# Patient Record
Sex: Female | Born: 1950 | Race: White | Hispanic: No | Marital: Married | Smoking: Never smoker
Health system: Southern US, Community
[De-identification: ages and names within clinical notes are randomized; demographics above are authoritative.]

## PROBLEM LIST (undated history)

## (undated) DIAGNOSIS — M25862 Other specified joint disorders, left knee: Secondary | ICD-10-CM

## (undated) DIAGNOSIS — T7840XA Allergy, unspecified, initial encounter: Secondary | ICD-10-CM

## (undated) DIAGNOSIS — R197 Diarrhea, unspecified: Secondary | ICD-10-CM

## (undated) DIAGNOSIS — Z9889 Other specified postprocedural states: Secondary | ICD-10-CM

## (undated) DIAGNOSIS — R011 Cardiac murmur, unspecified: Secondary | ICD-10-CM

## (undated) DIAGNOSIS — C50919 Malignant neoplasm of unspecified site of unspecified female breast: Secondary | ICD-10-CM

## (undated) DIAGNOSIS — R112 Nausea with vomiting, unspecified: Secondary | ICD-10-CM

## (undated) HISTORY — DX: Malignant neoplasm of unspecified site of unspecified female breast: C50.919

## (undated) HISTORY — DX: Allergy, unspecified, initial encounter: T78.40XA

## (undated) HISTORY — PX: AUGMENTATION MAMMAPLASTY: SUR837

## (undated) HISTORY — DX: Diarrhea, unspecified: R19.7

## (undated) HISTORY — PX: BREAST RECONSTRUCTION WITH PLACEMENT OF TISSUE EXPANDER AND FLEX HD (ACELLULAR HYDRATED DERMIS): SHX6295

## (undated) HISTORY — DX: Other specified joint disorders, left knee: M25.862

## (undated) HISTORY — DX: Cardiac murmur, unspecified: R01.1

## (undated) HISTORY — PX: ABDOMINAL HYSTERECTOMY: SHX81

---

## 1999-02-19 ENCOUNTER — Other Ambulatory Visit: Admission: RE | Admit: 1999-02-19 | Discharge: 1999-02-19 | Payer: Self-pay | Admitting: Obstetrics & Gynecology

## 1999-08-04 ENCOUNTER — Inpatient Hospital Stay (HOSPITAL_COMMUNITY): Admission: EM | Admit: 1999-08-04 | Discharge: 1999-08-05 | Payer: Self-pay | Admitting: Emergency Medicine

## 1999-08-04 ENCOUNTER — Encounter: Payer: Self-pay | Admitting: Emergency Medicine

## 2000-02-24 ENCOUNTER — Other Ambulatory Visit: Admission: RE | Admit: 2000-02-24 | Discharge: 2000-02-24 | Payer: Self-pay | Admitting: Obstetrics & Gynecology

## 2001-05-16 ENCOUNTER — Other Ambulatory Visit: Admission: RE | Admit: 2001-05-16 | Discharge: 2001-05-16 | Payer: Self-pay | Admitting: Obstetrics & Gynecology

## 2002-08-16 ENCOUNTER — Other Ambulatory Visit: Admission: RE | Admit: 2002-08-16 | Discharge: 2002-08-16 | Payer: Self-pay | Admitting: Obstetrics & Gynecology

## 2003-10-22 ENCOUNTER — Other Ambulatory Visit: Admission: RE | Admit: 2003-10-22 | Discharge: 2003-10-22 | Payer: Self-pay | Admitting: Obstetrics & Gynecology

## 2004-09-26 HISTORY — PX: COLONOSCOPY: SHX174

## 2004-11-03 ENCOUNTER — Other Ambulatory Visit: Admission: RE | Admit: 2004-11-03 | Discharge: 2004-11-03 | Payer: Self-pay | Admitting: Obstetrics & Gynecology

## 2005-04-06 ENCOUNTER — Ambulatory Visit: Payer: Self-pay | Admitting: Family Medicine

## 2005-04-20 ENCOUNTER — Ambulatory Visit: Payer: Self-pay | Admitting: Internal Medicine

## 2005-05-04 ENCOUNTER — Ambulatory Visit: Payer: Self-pay | Admitting: Internal Medicine

## 2005-12-28 ENCOUNTER — Other Ambulatory Visit: Admission: RE | Admit: 2005-12-28 | Discharge: 2005-12-28 | Payer: Self-pay | Admitting: Obstetrics & Gynecology

## 2007-02-02 ENCOUNTER — Telehealth: Payer: Self-pay | Admitting: Family Medicine

## 2007-08-06 ENCOUNTER — Telehealth (INDEPENDENT_AMBULATORY_CARE_PROVIDER_SITE_OTHER): Payer: Self-pay | Admitting: Internal Medicine

## 2007-08-07 ENCOUNTER — Ambulatory Visit: Payer: Self-pay | Admitting: Family Medicine

## 2008-10-27 ENCOUNTER — Telehealth: Payer: Self-pay | Admitting: Family Medicine

## 2009-09-28 ENCOUNTER — Ambulatory Visit: Payer: Self-pay | Admitting: Family Medicine

## 2009-09-28 ENCOUNTER — Telehealth: Payer: Self-pay | Admitting: Family Medicine

## 2009-10-23 ENCOUNTER — Ambulatory Visit: Payer: Self-pay | Admitting: Family Medicine

## 2010-10-28 NOTE — Assessment & Plan Note (Signed)
Summary: drainage cough/mk   Vital Signs:  Patient profile:   60 year old female Height:      65 inches Weight:      136.50 pounds BMI:     22.80 Temp:     98 degrees F oral Pulse rate:   76 / minute Pulse rhythm:   regular BP sitting:   122 / 80  (left arm) Cuff size:   regular  Vitals Entered By: Delilah Shan CMA Duncan Dull) (October 23, 2009 11:04 AM) CC: Drainage, cough   History of Present Illness: 60 yo female who has had URI symptoms for 1 month.  Was seen here on 09/28/09, symptoms consistent with viral URI. Advised to take Tessalon and Mucinex.  Developped itchy rash so stopped taking them. Symptoms worsened so she tried plain guafeneisin and Tessalon and had rash again, unsure which medication is trigger. This week she feels worse, now has chills, productive cough,  and increased sinus pressure and she doesn't know what to  take. Afebrile here but feels like she is running a fever.  Taking Alleve and claritin with no relief of symptoms.  Current Medications (verified): 1)  Flonase 50 Mcg/act  Susp (Fluticasone Propionate) .... 2 Sprays Each Nostril Once Daily For Congestion 2)  Nasonex 50 Mcg/act Susp (Mometasone Furoate) .... Inhale 1 Spray in Each Nostril Once Daily 3)  Tessalon 200 Mg Caps (Benzonatate) .... One Tab By Mouth Two Times A Day As Needed For Cough. 4)  Azithromycin 250 Mg  Tabs (Azithromycin) .... 2 By  Mouth Today and Then 1 Daily For 4 Days  Allergies (verified): No Known Drug Allergies  Review of Systems      See HPI General:  Complains of chills and fever. ENT:  Complains of nasal congestion and sinus pressure; denies ear discharge, postnasal drainage, and sore throat. CV:  Denies chest pain or discomfort. Resp:  Complains of cough and sputum productive; denies shortness of breath and wheezing. GI:  Denies abdominal pain, diarrhea, nausea, and vomiting.  Physical Exam  General:  alert, well-developed, and well-nourished.   Ears:  TM retracted  bilaterally. Nose:  nasal dischargemucosal pallor.   sinuses +/- Mouth:  minimally injected without exudate, min gray thick PND. Lungs:  normal respiratory effort, no accessory muscle use, and normal breath sounds.   Heart:  Normal rate and regular rhythm. S1 and S2 normal without gallop, murmur, click, rub or other extra sounds. Extremities:  no edema Cervical Nodes:  No lymphadenopathy noted Psych:  Cognition and judgment appear intact. Alert and cooperative with normal attention span and concentration. No apparent delusions, illusions, hallucinations   Impression & Recommendations:  Problem # 1:  SINUSITIS, ACUTE (ICD-461.9) Assessment New Given duration and progression of symptoms, will treat with Abx.   IN terms of supportive care, would continue with NSAID and claritin since we are unsure which cough suppressants she can tolerate. Pt in agreement with plan. Her updated medication list for this problem includes:    Flonase 50 Mcg/act Susp (Fluticasone propionate) .Marland Kitchen... 2 sprays each nostril once daily for congestion    Nasonex 50 Mcg/act Susp (Mometasone furoate) ..... Inhale 1 spray in each nostril once daily    Tessalon 200 Mg Caps (Benzonatate) ..... One tab by mouth two times a day as needed for cough.    Azithromycin 250 Mg Tabs (Azithromycin) .Marland Kitchen... 2 by  mouth today and then 1 daily for 4 days  Complete Medication List: 1)  Flonase 50 Mcg/act Susp (Fluticasone propionate) .Marland KitchenMarland KitchenMarland Kitchen  2 sprays each nostril once daily for congestion 2)  Nasonex 50 Mcg/act Susp (Mometasone furoate) .... Inhale 1 spray in each nostril once daily 3)  Tessalon 200 Mg Caps (Benzonatate) .... One tab by mouth two times a day as needed for cough. 4)  Azithromycin 250 Mg Tabs (Azithromycin) .... 2 by  mouth today and then 1 daily for 4 days Prescriptions: AZITHROMYCIN 250 MG  TABS (AZITHROMYCIN) 2 by  mouth today and then 1 daily for 4 days  #6 x 0   Entered and Authorized by:   Ruthe Mannan MD   Signed by:    Ruthe Mannan MD on 10/23/2009   Method used:   [facility]        [facility] ([facility]       [address]       [place], [place]  [address]       Ph: [phone] or [phone]       Fax: [phone]   RxID:   [id number]   Current Allergies (reviewed today): No known allergies

## 2010-10-28 NOTE — Progress Notes (Signed)
Summary: ok to take flonase?  Phone Note Call from Patient Call back at Home Phone [phone]   Caller: Patient Summary of Call: Pt was seen earlier today and she is asking if ok to take her flonase after 24 hours.  If so, she will need some [facility].  Please advise. Initial call taken by: Lowella Petties CMA,  September 28, 2009 3:39 PM  Follow-up for Phone Call        Is ok. Script sent. Follow-up by: Shaune Leeks MD,  September 28, 2009 3:57 PM  Additional Follow-up for Phone Call Additional follow up Details #1::        Patient notified as instructed by telephone. Additional Follow-up by: Sydell Axon LPN,  September 28, 2009 5:04 PM    Prescriptions: FLONASE 50 MCG/ACT  SUSP (FLUTICASONE PROPIONATE) 2 sprays each nostril once daily for congestion  #1 x 0   Entered and Authorized by:   Shaune Leeks MD   Signed by:   Sydell Axon LPN on 57/84/6962   Method used:   [facility]        [facility] ([facility]ail[facility]       [address]       [place], [place]  [address]       Ph: [phone] or [phone]       Fax: [phone]   RxID:   [id number]

## 2010-10-28 NOTE — Assessment & Plan Note (Signed)
Summary: VIRUS/RASH/DLO   Vital Signs:  Patient profile:   60 year old female Weight:      136.25 pounds Temp:     98.1 degrees F oral Pulse rate:   72 / minute Pulse rhythm:   regular BP sitting:   128 / 82  (left arm) Cuff size:   regular  Vitals Entered By: Linde Gillis CMA [facility] Dull) (September 28, 2009 12:18 PM) CC: ? virus, rash   History of Present Illness: Pt here for "bad cold"...has taken Mucinex DM  and then took Nyquil for a few nights. She has been "Miserable" over the weekend with cough and clogged in the head. She has also used some Afrin, pouring it in her nose to open her up. She used the [employer] this AM. She has felt she had fever and has had chills. New Years Eve has been her worst day. She has not had headache. She has had minimal color changes and has minimal sinus pressure ini  the right max this AM, no bad ST. Rash bothered her, shower felt like pins.   Problems Prior to Update: 1)  Sinusitis, Acute  (ICD-461.9)  Medications Prior to Update: 1)  Flonase 50 Mcg/act  Susp (Fluticasone Propionate) .... 2 Sprays Each Nostril Once Daily For Congestion 2)  Augmentin 875-125 Mg  Tabs (Amoxicillin-Pot Clavulanate) .Marland Kitchen.. 1 Bid 3)  Nasonex 50 Mcg/act Susp (Mometasone Furoate) .... Inhale 1 Spray in Each Nostril Once Daily  Allergies (verified): No Known Drug Allergies  Physical Exam  General:  alert, well-developed, and well-nourished.   Head:  normocephalic, atraumatic, and no abnormalities observed.  Sinuses NT. Eyes:  Conjunctiva clear bilaterally.  Ears:  TMs nml with good LR. Nose:  Reasonably clear with septal deviation to the left. Mouth:  minimally injected without exudate, min gray thick PND. Neck:  No deformities, masses, or tenderness noted. Lungs:  normal respiratory effort, no accessory muscle use, and normal breath sounds.   Heart:  Normal rate and regular rhythm. S1 and S2 normal without gallop, murmur, click, rub or other extra sounds.   Impression  & Recommendations:  Problem # 1:  URI (ICD-465.9) Assessment New  See instructions.  Instructed on symptomatic treatment. Call if symptoms persist or worsen.   Her updated medication list for this problem includes:    Tessalon 200 Mg Caps (Benzonatate) ..... One tab by mouth two times a day as needed for cough.  Problem # 2:  DERMATITIS (ICD-692.9) Assessment: New  Med induced with most intense outbreak on the chest but involving the trunk vs viral axanthem vs dissem strep/staph disease. Favor viral exanthem , but stop all meds for 24 hrs and asess. See instructions.  Discussed avoidance of triggers and symptomatic treatment.   Complete Medication List: 1)  Flonase 50 Mcg/act Susp (Fluticasone propionate) .... 2 sprays each nostril once daily for congestion 2)  Nasonex 50 Mcg/act Susp (Mometasone furoate) .... Inhale 1 spray in each nostril once daily 3)  Tessalon 200 Mg Caps (Benzonatate) .... One tab by mouth two times a day as needed for cough.  Patient Instructions: 1)  Take nothing for congestion/cold/cough for 24 hrs. 2)  Take Guaifenesin by [facility] and getting MUCOUS RELIEF EXPECTORANT (400mg ), take 11/2 tabs by mouth AM and NOON. 3)  Drink lots of fluids anytime taking Guaifenesin.  4)  Take Aleve 2 tabs after brfst and supper.  5)  Tessalon 200mg  three times a day  Prescriptions: TESSALON  200 MG CAPS (BENZONATATE) one tab by mouth two times a day as needed for cough.  #30 x 0   Entered and Authorized by:   Shaune Leeks MD   Signed by:   Shaune Leeks MD on 09/28/2009   Method used:   Print then Give to Patient   RxID:   [id number]   Current Allergies (reviewed today): No known allergies

## 2011-02-11 NOTE — H&P (Signed)
[place]  Patient:    Shari Gonzalez                    MRN: [id number] Adm. Date:  [id number] Attending:  Hilario Quarry Dictator:   Cornell Barman, P.A. CC:         Freddy Finner, M.D.             Titus Dubin. Alwyn Ren, M.D. [facility]                         History and Physical  ADMITTING DIAGNOSES: 1. Hypoglycemia. 2. Syncope.  HISTORY OF PRESENT ILLNESS:  Shari Gonzalez is a 60 year old white female in her  usual state of health until around 10 a.m.  The patient vaguely recalls events nd relies history obtained from co-workers.  She was told that she hit her elbow, walked several steps and fell forward striking her head.  She is not clear if she lost consciousness.  She remembers co-workers talking to her and asking her if he was okay.  She states that she felt like she was in a dream.  She states that even later after arriving in the emergency room she was confused about todays events. EMS was called to the seen and at that time her Accu-Chek was 31.  They attempted to give her Insta-Glucose but were not able to.  They did manage to give her one amp of D50 IV.  On arrival at [facility] her CBG was 68.  Later when the R.N. checked her CBG at [facility], it was 51.  She was then given one amp of D50 and coke.  Later CBG was 252.  At that point, Dr. Rosalia Hammers was preparing to send the patient home when the patient became "shaky."  They checked a CBG again and it was low at 58.  On my examination, the patient denies feeling shaky.  She describes one remote syncopal episode that occurred while waiting in a cafeteria line.  She does note that she often feels shaky and tremulous around 10 a.m. in the morning.  She states that she feels better after eating protein, i.e. peanut butter, and having a soft drink.  The patient states that she does not eat breakfast. She states that in the past she has tried to drink juice in the mornings but states that the  shaky feeling is worse when she has juice in the morning.  She states it is better when she has nothing at all.  The patient states that she has found that if she increases her proteins nd limits the amount of carbohydrates that she takes that she has less periods of feeling shaky.  The patient denies any polyuria, polydipsia or change in vision. She denies any weight loss or weight gain.  MEDICATIONS: 1. Premarin 1.25 mg q.d. 2. Estratest.  PAST MEDICAL HISTORY: 1. Status post hysterectomy with bilateral oophorectomy secondary to ovarian cysts    and endometriosis in 1997. 2. History of nasal polyps, status post resection in 1994 and 1995.  The patient denies diabetes mellitus, thyroid disorder, pancreatic disease, hypertension, cancer, arrhythmias, coronary artery disease, liver disease, gestational diabetes.  SOCIAL HISTORY:  The patient is married and has a 37 year old son.  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  She has no history of tobacco nd occasional alcohol use.  FAMILY HISTORY:  Father living age 21, history of lung cancer and  history of kidney cancer, status post nephrectomy.   Mother deceased at 22 with Alzheimers disease. The patient does report that her grandmother did have adult-onset diabetes mellitus.  REVIEW OF SYSTEMS:  The patient gets regular mammograms and Pap smears and is up to date.  She denies nausea, vomiting, fever, or chills, although one week ago she had an episode of nausea associated with upper respiratory symptoms including headache and sinus drainage.  This was relieved with Tylenol Sinus and zinc lozengers. he denies dysuria, weight changes, or change in appetite.  She has greater than 15 years of history of "shakes."  This is better with protein.  Patient weight consistently ranges between 118 and 121.  PHYSICAL EXAMINATION:  VITAL SIGNS:  The patient is afebrile.  Blood pressure is 128/76.  GENERAL:   This is a well-developed, well-nourished thin white female in no acute distress.  HEENT:  Atraumatic, normocephalic.  Pupils are equal, round and reactive to light and accommodation.  Oropharynx was clear.  NECK:  Without thyromegaly.  LUNGS:  Clear without wheezes, rales or rhonchi.  CARDIOVASCULAR:  Regular rate and rhythm without murmurs, rubs or gallops.  ABDOMEN:  Bowel sounds are present.  Soft, nontender with hepatosplenomegaly.  EXTREMITIES: Without edema.  LABORATORY DATA:  CMET was normal, although her total bilirubin was elevated at  1.1.  SGOT was minimally elevated at 39.  Alcohol was less than 10.  CBC with diff was normal.  Urine drug screen was negative.  CT of the head of the negative.  Chest x-ray was negative.  IMPRESSION: 1. Hypoglycemia, etiology not clear. 2. Syncope secondary to #1.  PLAN: 1. Check CBGs. 2. Check thyroid profile. DD:  08/04/99 TD:  08/04/99 Job: [id number]

## 2011-02-24 ENCOUNTER — Other Ambulatory Visit: Payer: Self-pay | Admitting: Obstetrics & Gynecology

## 2011-02-24 DIAGNOSIS — R928 Other abnormal and inconclusive findings on diagnostic imaging of breast: Secondary | ICD-10-CM

## 2011-03-03 ENCOUNTER — Other Ambulatory Visit: Payer: Self-pay | Admitting: Obstetrics & Gynecology

## 2011-03-03 ENCOUNTER — Ambulatory Visit
Admission: RE | Admit: 2011-03-03 | Discharge: 2011-03-03 | Disposition: A | Discharge status: Home / Self Care | Payer: 59 | Source: Ambulatory Visit | Attending: Obstetrics & Gynecology | Admitting: Obstetrics & Gynecology

## 2011-03-03 DIAGNOSIS — R928 Other abnormal and inconclusive findings on diagnostic imaging of breast: Secondary | ICD-10-CM

## 2011-03-03 DIAGNOSIS — R921 Mammographic calcification found on diagnostic imaging of breast: Secondary | ICD-10-CM

## 2011-03-07 ENCOUNTER — Other Ambulatory Visit: Payer: Self-pay | Admitting: Obstetrics & Gynecology

## 2011-03-07 ENCOUNTER — Ambulatory Visit
Admission: RE | Admit: 2011-03-07 | Discharge: 2011-03-07 | Disposition: A | Discharge status: Home / Self Care | Payer: 59 | Source: Ambulatory Visit | Attending: Obstetrics & Gynecology | Admitting: Obstetrics & Gynecology

## 2011-03-07 ENCOUNTER — Other Ambulatory Visit: Payer: Self-pay | Admitting: Diagnostic Radiology

## 2011-03-07 DIAGNOSIS — Z9889 Other specified postprocedural states: Secondary | ICD-10-CM

## 2011-03-07 DIAGNOSIS — R921 Mammographic calcification found on diagnostic imaging of breast: Secondary | ICD-10-CM

## 2011-03-08 ENCOUNTER — Other Ambulatory Visit: Payer: Self-pay | Admitting: Obstetrics & Gynecology

## 2011-03-08 ENCOUNTER — Ambulatory Visit
Admission: RE | Admit: 2011-03-08 | Discharge: 2011-03-08 | Disposition: A | Discharge status: Home / Self Care | Payer: 59 | Source: Ambulatory Visit | Attending: Obstetrics & Gynecology | Admitting: Obstetrics & Gynecology

## 2011-03-08 DIAGNOSIS — C50912 Malignant neoplasm of unspecified site of left female breast: Secondary | ICD-10-CM

## 2011-03-08 DIAGNOSIS — Z9889 Other specified postprocedural states: Secondary | ICD-10-CM

## 2011-03-10 ENCOUNTER — Encounter (INDEPENDENT_AMBULATORY_CARE_PROVIDER_SITE_OTHER): Payer: Self-pay | Admitting: General Surgery

## 2011-03-10 ENCOUNTER — Ambulatory Visit (HOSPITAL_COMMUNITY)
Admission: RE | Admit: 2011-03-10 | Discharge: 2011-03-10 | Disposition: A | Discharge status: Home / Self Care | Payer: 59 | Source: Ambulatory Visit | Attending: Obstetrics & Gynecology | Admitting: Obstetrics & Gynecology

## 2011-03-10 ENCOUNTER — Other Ambulatory Visit (INDEPENDENT_AMBULATORY_CARE_PROVIDER_SITE_OTHER): Payer: Self-pay | Admitting: General Surgery

## 2011-03-10 DIAGNOSIS — C50912 Malignant neoplasm of unspecified site of left female breast: Secondary | ICD-10-CM

## 2011-03-10 DIAGNOSIS — D0590 Unspecified type of carcinoma in situ of unspecified breast: Secondary | ICD-10-CM | POA: Insufficient documentation

## 2011-03-10 DIAGNOSIS — C50919 Malignant neoplasm of unspecified site of unspecified female breast: Secondary | ICD-10-CM

## 2011-03-10 MED ORDER — GADOBENATE DIMEGLUMINE 529 MG/ML IV SOLN
15.0000 mL | Freq: Once | INTRAVENOUS | Status: AC | PRN
  Administered 2011-03-10: 15 mL via INTRAVENOUS

## 2011-03-11 ENCOUNTER — Encounter (HOSPITAL_BASED_OUTPATIENT_CLINIC_OR_DEPARTMENT_OTHER)
Admission: RE | Admit: 2011-03-11 | Discharge: 2011-03-11 | Disposition: A | Discharge status: Home / Self Care | Payer: 59 | Source: Ambulatory Visit | Attending: General Surgery | Admitting: General Surgery

## 2011-03-11 LAB — COMPREHENSIVE METABOLIC PANEL
ALT: 19 U/L (ref 0–35)
AST: 22 U/L (ref 0–37)
Alkaline Phosphatase: 52 U/L (ref 39–117)
CO2: 30 mEq/L (ref 19–32)
Calcium: 8.9 mg/dL (ref 8.4–10.5)
GFR calc Af Amer: >60 mL/min (ref >60)
GFR calc non Af Amer: >60 mL/min (ref >60)
Glucose, Bld: 96 mg/dL (ref 70–99)
Potassium: 3.8 mEq/L (ref 3.5–5.1)
Sodium: 138 mEq/L (ref 135–145)
Total Protein: 6.5 g/dL (ref 6.0–8.3)

## 2011-03-11 LAB — URINALYSIS, ROUTINE W REFLEX MICROSCOPIC
Bilirubin Urine: NEGATIVE
Glucose, UA: NEGATIVE mg/dL
Hgb urine dipstick: NEGATIVE
Ketones, ur: NEGATIVE mg/dL
Protein, ur: NEGATIVE mg/dL
Urobilinogen, UA: 0.2 mg/dL (ref 0.0–1.0)

## 2011-03-11 LAB — URINE MICROSCOPIC-ADD ON

## 2011-03-12 ENCOUNTER — Ambulatory Visit (HOSPITAL_COMMUNITY): Admission: RE | Admit: 2011-03-12 | Payer: 59 | Source: Ambulatory Visit

## 2011-03-13 ENCOUNTER — Other Ambulatory Visit: Payer: 59

## 2011-03-14 ENCOUNTER — Other Ambulatory Visit (INDEPENDENT_AMBULATORY_CARE_PROVIDER_SITE_OTHER): Payer: Self-pay | Admitting: General Surgery

## 2011-03-14 ENCOUNTER — Ambulatory Visit
Admission: RE | Admit: 2011-03-14 | Discharge: 2011-03-14 | Disposition: A | Discharge status: Home / Self Care | Payer: 59 | Source: Ambulatory Visit | Attending: General Surgery | Admitting: General Surgery

## 2011-03-14 ENCOUNTER — Ambulatory Visit (HOSPITAL_BASED_OUTPATIENT_CLINIC_OR_DEPARTMENT_OTHER)
Admission: RE | Admit: 2011-03-14 | Discharge: 2011-03-14 | Disposition: A | Discharge status: Home / Self Care | Payer: 59 | Source: Ambulatory Visit | Attending: General Surgery | Admitting: General Surgery

## 2011-03-14 DIAGNOSIS — Z01810 Encounter for preprocedural cardiovascular examination: Secondary | ICD-10-CM | POA: Insufficient documentation

## 2011-03-14 DIAGNOSIS — D0590 Unspecified type of carcinoma in situ of unspecified breast: Secondary | ICD-10-CM | POA: Insufficient documentation

## 2011-03-14 DIAGNOSIS — C50919 Malignant neoplasm of unspecified site of unspecified female breast: Secondary | ICD-10-CM

## 2011-03-14 DIAGNOSIS — C4492 Squamous cell carcinoma of skin, unspecified: Secondary | ICD-10-CM | POA: Insufficient documentation

## 2011-03-15 ENCOUNTER — Other Ambulatory Visit: Payer: 59

## 2011-03-15 ENCOUNTER — Encounter: Payer: Self-pay | Admitting: Family Medicine

## 2011-03-16 ENCOUNTER — Ambulatory Visit
Admission: RE | Admit: 2011-03-16 | Discharge: 2011-03-16 | Disposition: A | Discharge status: Home / Self Care | Payer: 59 | Source: Ambulatory Visit | Attending: Radiation Oncology | Admitting: Radiation Oncology

## 2011-03-16 DIAGNOSIS — Z90710 Acquired absence of both cervix and uterus: Secondary | ICD-10-CM | POA: Insufficient documentation

## 2011-03-16 DIAGNOSIS — C50419 Malignant neoplasm of upper-outer quadrant of unspecified female breast: Secondary | ICD-10-CM | POA: Insufficient documentation

## 2011-03-16 DIAGNOSIS — Z79899 Other long term (current) drug therapy: Secondary | ICD-10-CM | POA: Insufficient documentation

## 2011-03-16 DIAGNOSIS — Z85828 Personal history of other malignant neoplasm of skin: Secondary | ICD-10-CM | POA: Insufficient documentation

## 2011-03-16 NOTE — Op Note (Signed)
NAME:  Shari Gonzalez, Shari Gonzalez NO.:  [id number]  MEDICAL RECORD NO.:  [id number]  LOCATION:                                 FACILITY:  PHYSICIAN:  Angelia Mould. 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  OPERATIONS PERFORMED: 1. Left partial mastectomy with needle localization. 2. Re-excision of inferior margin, left breast lumpectomy site. 3. Excision 8 x 12 mm pigmented nevus, left breast skin at 5:30     position.  SURGEON:  Angelia Mould. Derrell Lolling, MD  OPERATIVE INDICATIONS:  This is a 60 year old Caucasian female in excellent health.  She gets mammograms annually.  Recent mammogram showed an approximately 1.5 cm area of microcalcifications in the upper outer quadrant of the left breast at about the 2:30 position.  Further imaging studies and image-guided biopsy were performed and pathology report showed ductal carcinoma in situ.  The estrogen receptor is pending.  MRI shows this to be a solitary finding, no greater than 2 cm in size.  She was evaluated as an outpatient and counseled regarding left partial mastectomy versus mastectomy.  She favored breast conservation.  She also had a red scaly nevus of the left breast skin at the 5:30 position which has been present for several months and she asked that be excised as well.  She is brought to operating room electively.  OPERATIVE TECHNIQUE:  Following the induction of general endotracheal anesthesia, the patient's left breast was prepped and draped in a sterile fashion.  Intravenous antibiotics were given.  Surgical time-out was held identifying correct patient and correct procedure and correct site.  Marcaine  0.5% with epinephrine was used as local infiltration anesthetic.  I reviewed the wire localization films and observed the wire entering from the very lateral part of the breast.  I chose to use a transverse radially oriented incision that went right through the wire insertion site.  Dissection was carried down into the breast tissue widely around the wire all the way down to the pectoralis fascia.  The specimen was removed and marked with a six color margin marker kit.  I felt that I was possibly close on the inferior margin, so I we excised to the inferior margin and also marked it with the ink on the new inferior margin and labeled it as such.  The specimen mammogram showed that the wire and the calcifications in the marker clip were within the specimen. The breast specimen and the reexcised inferior margin were sent to pathology.  The wound was irrigated with saline.  Hemostasis was excellent.  The breast tissues were closed in layers with interrupted sutures of 3-0 Vicryl and the skin closed with a running subcuticular suture of 4-0 Monocryl and Dermabond.  I then excised the nevus in the inferior breast at the 5:30 position. This was measured as 8 mL transversely x 12 mm longitudinally.  I made an elliptical incision around this with  about a 1-2 mm margin.  Using a sharp knife, I excised this all the way down into the subcutaneous tissue and sent it for routine histology.  Hemostasis was excellent, we then achieved with electrocautery.  The skin was closed with a running subcuticular suture of 4-0 Monocryl and Dermabond.  Ice pack was placed.  The patient tolerated the procedure well and was taken to the recovery room in stable condition.  Estimated blood loss was about 10-15 mL.  Complications none.  Sponge, needle, and instrument counts were correct.     Angelia Mould. Derrell Lolling, M.D.     HMI/MEDQ  D:  03/14/2011  T:  03/15/2011  Job:  [id number]  cc:   Daryl Eastern,  M.D. Marne A. Milinda Antis, MD  Electronically Signed by Claud Kelp M.D. on 03/16/2011 05:50:29 AM

## 2011-03-21 ENCOUNTER — Other Ambulatory Visit: Payer: Self-pay | Admitting: Oncology

## 2011-03-21 ENCOUNTER — Encounter (INDEPENDENT_AMBULATORY_CARE_PROVIDER_SITE_OTHER): Payer: Self-pay | Admitting: General Surgery

## 2011-03-21 ENCOUNTER — Ambulatory Visit (INDEPENDENT_AMBULATORY_CARE_PROVIDER_SITE_OTHER): Payer: 59 | Admitting: General Surgery

## 2011-03-21 ENCOUNTER — Encounter (HOSPITAL_BASED_OUTPATIENT_CLINIC_OR_DEPARTMENT_OTHER): Payer: 59 | Admitting: Oncology

## 2011-03-21 VITALS — BP 120/70 | HR 64 | Temp 98.4°F | Resp 16 | Ht 65.0 in | Wt 133.0 lb

## 2011-03-21 DIAGNOSIS — C50419 Malignant neoplasm of upper-outer quadrant of unspecified female breast: Secondary | ICD-10-CM

## 2011-03-21 DIAGNOSIS — C50919 Malignant neoplasm of unspecified site of unspecified female breast: Secondary | ICD-10-CM

## 2011-03-21 DIAGNOSIS — C50912 Malignant neoplasm of unspecified site of left female breast: Secondary | ICD-10-CM

## 2011-03-21 LAB — CBC WITH DIFFERENTIAL/PLATELET
BASO%: 0.4 % (ref 0.0–2.0)
LYMPH%: 22.8 % (ref 14.0–49.7)
MCHC: 34.4 g/dL (ref 31.5–36.0)
MCV: 92.1 fL (ref 79.5–101.0)
MONO%: 3.8 % (ref 0.0–14.0)
Platelets: 177 10*3/uL (ref 145–400)
RBC: 4.53 10*6/uL (ref 3.70–5.45)
WBC: 5.5 10*3/uL (ref 3.9–10.3)

## 2011-03-21 LAB — COMPREHENSIVE METABOLIC PANEL
ALT: 20 U/L (ref 0–35)
AST: 19 U/L (ref 0–37)
Albumin: 4.2 g/dL (ref 3.5–5.2)
Alkaline Phosphatase: 64 U/L (ref 39–117)
Potassium: 3.5 mEq/L (ref 3.5–5.3)
Sodium: 140 mEq/L (ref 135–145)
Total Bilirubin: 0.5 mg/dL (ref 0.3–1.2)
Total Protein: 7.4 g/dL (ref 6.0–8.3)

## 2011-03-21 NOTE — Patient Instructions (Signed)
You will see Dr. Odis Luster on March 23, 2011.   I will see you back in the office in 3 weeks for further discussion.  If you decide to proceed with surgery, we will schedule that in the interim.

## 2011-03-21 NOTE — Progress Notes (Signed)
Subjective:     Patient ID: Shari Gonzalez, female   DOB: [birth year], 60 y.o.   MRN: [id number]    BP 120/70  Pulse 64  Temp(Src) 98.4 F (36.9 C) (Temporal)  Resp 16  Ht 5\' 5"  (1.651 m)  Wt 133 lb (60.328 kg)  BMI 22.13 kg/m2    HPI Mrs. Shari Gonzalez underwent left partial mastectomy with needle localization, reexcision of inferior margin, and excision of a nevus of the left breast on March 14, 2011. The breast specimen shows multifocal high-grade ductal carcinoma in situ. The tumor was present at the superior margin in multiple areas. It was also present at the anterior margin. The tumor was very close to the inferior margin. On the reexcision of the inferior margin  It was close once again showing high-grade ductal carcinoma in situ with the tumor being 3 mm from the nearest resection margin. I have discussed the pathology report with the patient by phone, and with the patient and her husband in the office today. I have also reviewed his with Dr. Frederica Kuster in pathology. She is doing reasonably well with healing. I've explained her pathology report to her. 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 She already has scheduled an appointment at [facility] this Wednesday, March 23, 2011 to discuss reconstructive options if she chooses mastectomy. We spent a long time talking about the pathology report. We talked about options for intervention. She is aware that reexcision might result in negative margins with an acceptable cosmetic defect, might result in negative margins with an unacceptable cosmetic defect, or might result of positive margins and necessitate a ultimate mastectomy. She knows that proceeding to mastectomy is also acceptable option in this situation with high-grade multifocal ductal carcinoma in situ, and she is strongly considering that.  Review of Systems    Constitutional: Negative.   HENT: Negative.   Cardiovascular: Negative.   Genitourinary: Negative.   Psychiatric/Behavioral: Negative.        Objective:   Physical Exam  Neck: Normal range of motion. Neck supple. No tracheal deviation present. No thyromegaly present.  Pulmonary/Chest: Left breast exhibits skin change and tenderness. Left breast exhibits no inverted nipple and no mass. Breasts are symmetrical.    Lymphadenopathy:    She has no cervical adenopathy.       Assessment:     High-grade ductal carcinoma in situ, left breast, lateral quadrant. This is a multifocal ductal carcinoma in situ, and there are multiple areas of positive margins. Reexcision will be necessary to control her disease locally.    Plan:     I spent a long time talking about the extent of her disease. She is aware of her options for surgical intervention. She will see Dr. [facility] S[facility] this coming Wednesday to discuss options for reconstruction, should she use should she choose mastectomy. She is aware that reconstruction can be considered in the setting of a mastectomy, and can also be considered in the setting of a lumpectomy should cosmetic defect be unacceptable. At this time, and she will see Dr. Odis Luster, and will also make up with me in 2-3 weeks. Should she decide to proceed with scheduling of her surgery we will do that in the interim.

## 2011-03-24 ENCOUNTER — Other Ambulatory Visit (INDEPENDENT_AMBULATORY_CARE_PROVIDER_SITE_OTHER): Payer: Self-pay | Admitting: General Surgery

## 2011-03-24 ENCOUNTER — Encounter (INDEPENDENT_AMBULATORY_CARE_PROVIDER_SITE_OTHER): Payer: Self-pay | Admitting: General Surgery

## 2011-03-24 DIAGNOSIS — C50912 Malignant neoplasm of unspecified site of left female breast: Secondary | ICD-10-CM

## 2011-03-24 NOTE — Progress Notes (Signed)
Subjective:     Patient ID: Shari Gonzalez, female   DOB: [birth year], 60 y.o.   MRN: [id number]    There were no vitals taken for this visit.    HPI Shari Gonzalez has seen Dr. [facility] Sj[facility]. She has discussed reconstructive options and she has decided she does want to go ahead and proceed with left total mastectomy because of her multifocal DCIS. We are scheduling her for left total mastectomy and sentinel node biopsy, to be followed by reconstruction with Dr. [facility]. I had completed her placed a sheet and sent this to scheduling today.  Review of Systems     Objective:   Physical Exam     Assessment:        Plan:    schedule mastectomy and SLN.

## 2011-03-27 HISTORY — PX: MASTECTOMY: SHX3

## 2011-04-05 ENCOUNTER — Other Ambulatory Visit (HOSPITAL_COMMUNITY): Payer: 59

## 2011-04-06 ENCOUNTER — Encounter (HOSPITAL_COMMUNITY)
Admission: RE | Admit: 2011-04-06 | Discharge: 2011-04-06 | Disposition: A | Discharge status: Home / Self Care | Payer: 59 | Source: Ambulatory Visit | Attending: General Surgery | Admitting: General Surgery

## 2011-04-06 LAB — DIFFERENTIAL
Basophils Relative: 1 % (ref 0–1)
Eosinophils Absolute: 0.1 10*3/uL (ref 0.0–0.7)
Lymphs Abs: 1.4 10*3/uL (ref 0.7–4.0)
Monocytes Absolute: 0.4 10*3/uL (ref 0.1–1.0)
Monocytes Relative: 7 % (ref 3–12)

## 2011-04-06 LAB — COMPREHENSIVE METABOLIC PANEL
AST: 41 U/L — ABNORMAL HIGH (ref 0–37)
Albumin: 4.5 g/dL (ref 3.5–5.2)
Chloride: 101 mEq/L (ref 96–112)
Creatinine, Ser: 0.69 mg/dL (ref 0.50–1.10)
Potassium: 3.8 mEq/L (ref 3.5–5.1)
Total Bilirubin: 0.5 mg/dL (ref 0.3–1.2)
Total Protein: 7.3 g/dL (ref 6.0–8.3)

## 2011-04-06 LAB — URINALYSIS, ROUTINE W REFLEX MICROSCOPIC
Ketones, ur: NEGATIVE mg/dL
Leukocytes, UA: NEGATIVE
Nitrite: NEGATIVE
Specific Gravity, Urine: 1.009 (ref 1.005–1.030)
pH: 6.5 (ref 5.0–8.0)

## 2011-04-06 LAB — CBC
Platelets: 176 10*3/uL (ref 150–400)
RBC: 4.7 MIL/uL (ref 3.87–5.11)
WBC: 5.4 10*3/uL (ref 4.0–10.5)

## 2011-04-06 LAB — SURGICAL PCR SCREEN: Staphylococcus aureus: NEGATIVE

## 2011-04-07 ENCOUNTER — Ambulatory Visit (HOSPITAL_COMMUNITY)
Admission: RE | Admit: 2011-04-07 | Discharge: 2011-04-07 | Disposition: A | Discharge status: Home / Self Care | Payer: 59 | Source: Ambulatory Visit | Attending: General Surgery | Admitting: General Surgery

## 2011-04-07 ENCOUNTER — Ambulatory Visit (HOSPITAL_COMMUNITY)
Admission: RE | Admit: 2011-04-07 | Discharge: 2011-04-09 | Disposition: A | Discharge status: Home / Self Care | Payer: 59 | Source: Ambulatory Visit | Attending: General Surgery | Admitting: General Surgery

## 2011-04-07 ENCOUNTER — Other Ambulatory Visit (INDEPENDENT_AMBULATORY_CARE_PROVIDER_SITE_OTHER): Payer: Self-pay | Admitting: General Surgery

## 2011-04-07 DIAGNOSIS — F329 Major depressive disorder, single episode, unspecified: Secondary | ICD-10-CM | POA: Insufficient documentation

## 2011-04-07 DIAGNOSIS — Z01812 Encounter for preprocedural laboratory examination: Secondary | ICD-10-CM | POA: Insufficient documentation

## 2011-04-07 DIAGNOSIS — C50919 Malignant neoplasm of unspecified site of unspecified female breast: Secondary | ICD-10-CM

## 2011-04-07 DIAGNOSIS — Z23 Encounter for immunization: Secondary | ICD-10-CM | POA: Insufficient documentation

## 2011-04-07 DIAGNOSIS — C50912 Malignant neoplasm of unspecified site of left female breast: Secondary | ICD-10-CM

## 2011-04-07 DIAGNOSIS — D0590 Unspecified type of carcinoma in situ of unspecified breast: Secondary | ICD-10-CM | POA: Insufficient documentation

## 2011-04-07 DIAGNOSIS — F3289 Other specified depressive episodes: Secondary | ICD-10-CM | POA: Insufficient documentation

## 2011-04-07 MED ORDER — TECHNETIUM TC 99M SULFUR COLLOID FILTERED
1.0000 | Freq: Once | INTRAVENOUS | Status: AC | PRN
  Administered 2011-04-07: 1 via INTRADERMAL

## 2011-04-10 NOTE — Op Note (Signed)
NAME:  Shari Gonzalez, Shari Gonzalez NO.:  [id number]  MEDICAL RECORD NO.:  [id number]  LOCATION:  5155                         FACILITY:  [facility]  PHYSICIAN:  Angelia Mould. 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  OPERATION PERFORMED: 1. Inject blue dye left breast. 2. Left total mastectomy. 3. Left axillary sentinel lymph node mapping and biopsy.  SURGEON:  Angelia Mould. Derrell Lolling, MD  FIRST ASSISTANT:  Lodema Pilot, MD  OPERATIVE INDICATIONS:  This is a 60 year old Caucasian female who was recently found to have an abnormal mammogram showing a 1.5-cm clustered area of microcalcifications in the lateral left breast slightly upper outer quadrant.  Eventually she got a biopsy showed ductal carcinoma in situ with calcifications and necrosis.  MRI suggested this was a localized process.  She underwent left partial mastectomy with needle localization recently and that showed multifocal ductal carcinoma in situ with multiple positive, discontinuous margins.  Options were discussed including re-excision of all margins or mastectomy.  She has elected to have a mastectomy with reconstruction.  She has been counseled by me as well as Dr. Etter Sjogren and is brought to the operating room electively.  OPERATIVE TECHNIQUE:  The patient was brought to the holding area at [facility] where the nuclear medicine technician injected the left breast with radionuclide.  The patient was taken to the operating room. General anesthesia was induced.  Intravenous antibiotics were given. The patient was identified as the correct patient, correct procedure and correct site.  Following an alcohol prep, I injected 5 mL of  blue dye in the subareolar area as well as the upper outer quadrant of the left breast.  The breast was massaged for about 4-5 minutes.  We then prepped the neck, the entire chest, both shoulders, both axilla and chest walls and draped this area.  Using a marking pen,  we marked the midline, the inframammary crease, and the margins of the left breast.  I made a transverse elliptical incision which included all of the previous incisions.  Skin flaps were raised superiorly to the infraclavicular area, medially to the parasternal area, inferiorly to the anterior rectus sheath, and laterally to the latissimus dorsi muscle.  We dissected up into the axilla.  Using the Neoprobe I found three very blue, very hot sentinel nodes and these were all removed and sent for routine histology.  No other sentinel nodes were found.  I dissected the left breast off the pectoralis fascia and dissected out the tail of Spence.  I marked the lateral skin margin with a silk suture and sent the breast specimen to the lab.  Hemostasis was excellent and achieved with electrocautery.  The wound was irrigated with saline.  The wound was packed with saline moistened sponges.  At this point, we had lost about 50 mL of blood.  Sponge, needle and instrument counts were correct.  I scrubbed out and Dr. Etter Sjogren scrubbed in to perform the reconstruction.  Angelia Mould. Derrell Lolling, M.D.     HMI/MEDQ  D:  04/07/2011  T:  04/07/2011  Job:  [id number]  cc:   Pierce Crane, M.D., F.R.C.P.C. Marne A. Milinda Antis, MD Daryl Eastern, M.D.  Electronically Signed by Claud Kelp M.D. on 04/10/2011 09:56:05 PM

## 2011-04-12 ENCOUNTER — Telehealth (INDEPENDENT_AMBULATORY_CARE_PROVIDER_SITE_OTHER): Payer: Self-pay | Admitting: General Surgery

## 2011-04-26 ENCOUNTER — Ambulatory Visit (INDEPENDENT_AMBULATORY_CARE_PROVIDER_SITE_OTHER): Payer: 59 | Admitting: General Surgery

## 2011-04-26 ENCOUNTER — Encounter (INDEPENDENT_AMBULATORY_CARE_PROVIDER_SITE_OTHER): Payer: Self-pay | Admitting: General Surgery

## 2011-04-26 DIAGNOSIS — C50919 Malignant neoplasm of unspecified site of unspecified female breast: Secondary | ICD-10-CM

## 2011-04-26 DIAGNOSIS — C50912 Malignant neoplasm of unspecified site of left female breast: Secondary | ICD-10-CM

## 2011-04-26 NOTE — Progress Notes (Signed)
Subjective:     Patient ID: Shari Gonzalez, female   DOB: [birth year], 60 y.o.   MRN: [id number]  HPI Patient returns for a postop check. She underwent left total mastectomy with sentinel lymph node biopsy. Dr. Odis Luster place a tissue expander. The final pathology report showed no residual carcinoma, despite the lumpectomy specimen showing multifocal positive margins. She is pleased with her progress to date. She has a little but of numbness under her arm. Dr. Odis Luster  is slowly expanding her tissue expander. All of her drains have been removed. She is scheduled to see Dr. Pierce Crane in about 2 weeks to make decisions regarding antiestrogen therapy. Review of Systems     Objective:   Physical Exam The patient looks good. Her husband is with her. The left mastectomy skin flaps looked very healthy. The tissue expander is tight. There's no signs of infection. The drain sites in the left axilla are dry. I removed the bandage and replaced them with Band-Aids.    Assessment:     High-grade ductal carcinoma in situ left breast, status post Left partial mastectomy with pathologic findings of multifocal positive margins. She socially underwent left mastectomy and insertion of tissue expander. She had no residual cancer and no invasive component. She is doing well at this point in the early postop period. Plan:     Continue close followup with Dr. Etter Sjogren.  Keep her appointment with Dr. Donnie Coffin in 2 weeks.  Return to see me in 5 months.  Plan right breast mammogram in May of 2013.

## 2011-04-26 NOTE — Patient Instructions (Signed)
Your wounds are healing very nicely. Continue appointments with Dr. Odis Luster to get your tissue expander fully expanded. Keep your appt. with Dr. Donnie Coffin so that you can decide if and when you're going to be placed on antiestrogen therapy. I will see you back in my office in 5 months. You will need to get a mammogram of the right breast in May of 2013.

## 2011-05-04 ENCOUNTER — Encounter (HOSPITAL_BASED_OUTPATIENT_CLINIC_OR_DEPARTMENT_OTHER): Payer: 59 | Admitting: Oncology

## 2011-05-04 DIAGNOSIS — C50419 Malignant neoplasm of upper-outer quadrant of unspecified female breast: Secondary | ICD-10-CM

## 2011-05-11 NOTE — Discharge Summary (Signed)
  NAME:  Shari Gonzalez, Shari Gonzalez NO.:  [id number]  MEDICAL RECORD NO.:  [id number]  LOCATION:  5155                         FACILITY:  [facility]  PHYSICIAN:  Etter Sjogren, M.D.     DATE OF BIRTH:  1951-05-12  DATE OF ADMISSION:  04/07/2011 DATE OF DISCHARGE:  04/09/2011                              DISCHARGE SUMMARY   FINAL DIAGNOSIS:  Left breast cancer.  PROCEDURES PERFORMED: 1. Left mastectomy. 2. Left sentinel lymph node biopsy. 3. Left breast reconstruction with tissue expander.  HISTORY OF PRESENT ILLNESS:  This 60 year old woman has a left breast cancer and mastectomy was planned.  She desired reconstruction.  Options discussed and she selected planned stage procedure with placement of tissue expander at the time of the mastectomy with eventual placement of an implant.  These procedures were discussed with her in detail and she understood the risks and possible complications and wished to proceed. For details of history and physical, please see chart.  COURSE IN HOSPITAL:  On admission, she was taken to the operating room, at which time, the mastectomy, sentinel lymph node biopsy, and reconstruction with tissue expander were performed.  She tolerated those procedures well.  Postoperatively, she did well.  She was afebrile. Chest soft.  No hematoma, no cellulitis.  Skin flaps from the mastectomy looked very good with no vascular compromise.  Drains were functioning thin drainage and by the second postoperative day, her pain was under good control and she wanted to be discharged.  DISPOSITION:  No lifting.  No vigorous activities.  No raising of arms overhead.  Empty the drain 3 times a day and record amounts.  Use a spirometer at home at least 12 times a day.  Follow up in the office this coming week.  DISCHARGE MEDICATIONS: 1. Dilaudid 2 mg total of 60 given 1-2 p.o. q.4-6 h. p.r.n. for pain. 2. Robaxin 500 mg total of 40 given 1 p.o. q.8. 3. Keflex 250 mg 1  p.o. q.i.d. for 5 days and then 1 p.o. b.i.d.     thereafter.  No shower.  No driving.  No lifting.  CONDITION:  Good.     Etter Sjogren, M.D.     DB/MEDQ  D:  04/09/2011  T:  04/09/2011  Job:  [id number]  Electronically Signed by Etter Sjogren M.D. on 05/11/2011 02:57:46 PM

## 2011-05-11 NOTE — Op Note (Signed)
  NAME:  Shari Gonzalez, Shari Gonzalez NO.:  [id number]  MEDICAL RECORD NO.:  [id number]  LOCATION:  5155                         FACILITY:  [facility]  PHYSICIAN:  Etter Sjogren, M.D.     DATE OF BIRTH:  1951/03/26  DATE OF PROCEDURE:  04/07/2011 DATE OF DISCHARGE:                              OPERATIVE REPORT   PREOPERATIVE DIAGNOSIS:  Left breast cancer.  POSTOPERATIVE DIAGNOSIS:  Left breast cancer.  PROCEDURE PERFORMED:  Left breast reconstruction with tissue expander.  SURGEON:  Etter Sjogren, MD  ANESTHESIA:  General.  ESTIMATED BLOOD LOSS:  8 mL.  DRAINS:  Two 19-French drains were left.  CLINICAL NOTE:  A 60 year old woman has left breast cancer, desires reconstruction mastectomy is planned.  She selected stage procedure, placement of tissue expander with eventual placement of implant.  The nature of these procedures and risks plus complications were discussed with her risks including but not limited to bleeding, infection, anesthesia complications, healing problems, scarring, loss of sensation and fluid accumulations, loss of skin, loss of tissue, failure to device, capsular contracture, wrinkles, ripples, displacement of device, asymmetry disappointment, pneumothorax, pulmonary embolism and she understood all this and wished to proceed.  DESCRIPTION OF PROCEDURE:  The patient was in the operating room and mastectomy completed by Dr. Derrell Lolling of General Surgery.  Skin flaps looked very healthy.  The space was irrigated thoroughly with saline and hemostasis with electrocautery.  The submuscular space was developed underneath the pectoralis major muscle, underneath the serratus anterior muscle and developing down to the inframammary crease, and marked preoperatively.  Thorough irrigation with saline as well as an antibiotic solution.  Hemostasis with electrocautery.  The expander was prepared after thoroughly cleaning the gloves.  This was the mentor, moderate  height, 450 mL, tissue expander soaked with antibiotic solution, 25 mL sterile saline was placed using closed filling system and all the air was removed.  The expander was turned to be soaked in antibiotic solution and again placed in space and it was inspected. Excellent hemostasis was confirmed.  Expander positioned and the closure of the muscle with 3-0 Vicryl, interrupted figure-of-eight sutures. Great care taken to avoid the damage of the underlying tissue expander which was kept under direct vision at all times.  Throughout this procedure and throughout the dissection, care was taken to avoid damage to the underlying chest cavity.  Antibiotic solution was placed into the space prior to tying the last suture and then again saline used to irrigate the mastectomy field and antibiotic solution was placed there as well.  Meticulous hemostasis with electrocautery and the skin closure with 3-0 Monocryl with interrupted inverted deep dermal sutures and Dermabond.  The bypass was placed around the drains and Tegaderm dry sterile dressing was placed over the mastectomy site.  No tape.  She was wrapped with an Ace wrap in the chest, dressing was also placed.  She was transferred to the recovery room in stable having tolerated the procedure well.     Etter Sjogren, M.D.     DB/MEDQ  D:  04/07/2011  T:  04/07/2011  Job:  [id number]  Electronically Signed by Etter Sjogren M.D. on 05/11/2011 02:57:37 PM

## 2011-06-28 ENCOUNTER — Telehealth: Payer: Self-pay | Admitting: Internal Medicine

## 2011-06-28 NOTE — Telephone Encounter (Signed)
Patient is c/o 4 weeks of diarrhea.  Dr Jennette Kettle has done stool studies for c-diff but they were negative.  She was started on a probiotic and this has made a slight improvement.  She is still having several stools a day.  I have scheduled an appt for tomorrow with Willette Cluster RNP at 1:30

## 2011-06-29 ENCOUNTER — Encounter: Payer: Self-pay | Admitting: Nurse Practitioner

## 2011-06-29 ENCOUNTER — Ambulatory Visit (INDEPENDENT_AMBULATORY_CARE_PROVIDER_SITE_OTHER): Payer: 59 | Admitting: Nurse Practitioner

## 2011-06-29 VITALS — BP 108/64 | HR 72 | Ht 65.0 in | Wt 138.8 lb

## 2011-06-29 DIAGNOSIS — R197 Diarrhea, unspecified: Secondary | ICD-10-CM

## 2011-06-29 MED ORDER — METRONIDAZOLE 500 MG PO TABS: ORAL_TABLET | ORAL | Status: DC

## 2011-06-29 NOTE — Patient Instructions (Addendum)
Please go to the basement level to have your labs drawn.  Continue the probiotic. We sent a prescription for the Flagyl to [facility].

## 2011-06-29 NOTE — Progress Notes (Signed)
06/29/2011 Shari Gonzalez [id number] Oct 30, 1950   HISTORY OF PRESENT ILLNESS: Patient is a 60 year old white female who had a completely normal colonoscopy for constipation with Dr. Leone Payor in Aug. 2006. She was diagnosed a few months ago with breast cancer and underwent a mastectomy in July for which she was on antibiotics. Six weeks later she began having malodorous diarrhea. No rectal bleeding. Started probiotics last week and doing better. Today, first day without diarrhea. C-diff toxin negative x1, stool culture negative. No fevers though has been hot and cold since hormones stopped.   Past Medical History  Diagnosis Date  . Allergy     seasonal  . Breast cancer     Past Surgical History  Procedure Date  . Abdominal hysterectomy     complete with BSO  . Mastectomy parial   . Breast surgery     bilateral mastectomy    reports that she has never smoked. She has never used smokeless tobacco. She reports that she drinks about 1.2 ounces of alcohol per week. She reports that she does not use illicit drugs. family history includes Alzheimer's disease in her mother; Breast cancer in her maternal aunt; Diabetes in her paternal grandmother; Gout in her father; Kidney cancer in her father; and Lung cancer in her father. Allergies  Allergen Reactions  . Dilaudid (Hydromorphone Hcl) Rash  . Mucinex Rash      Outpatient Encounter Prescriptions as of 06/29/2011  Medication Sig Dispense Refill  . calcium citrate-vitamin D 200-200 MG-UNIT TABS Take 1 tablet by mouth daily.        Marland Kitchen estrogen-methylTESTOSTERone (ESTRATEST HS) 0.625-1.25 MG per tablet Take 1 tablet by mouth daily. Advised 03-10-11 to d/c this med per Dr Derrell Lolling       . loratadine (CLARITIN) 10 MG tablet Take 10 mg by mouth daily.        . Multiple Vitamins-Minerals (MULTIVITAMIN WITH MINERALS) tablet Take 1 tablet by mouth daily.           REVIEW OF SYSTEMS  : All other systems reviewed and negative except where noted in  the History of Present Illness.   PHYSICAL EXAM: General: Well developed white female in no acute distress Head: Normocephalic and atraumatic Eyes:  sclerae anicteric,conjunctive pink. Ears: Normal auditory acuity Mouth: No deformity or lesions Neck: Supple, no masses.  Lungs: Clear throughout to auscultation Heart: Regular rate and rhythm; no murmurs heard Abdomen: Soft, non distended, nontender. No masses or hepatomegaly noted. Normal Bowel sounds Rectal: Deferred Musculoskeletal: Symmetrical with no gross deformities  Skin: No lesions on visible extremities Extremities: No edema or deformities noted Neurological: Alert oriented x 4, grossly nonfocal Cervical Nodes:  No significant cervical adenopathy Psychological:  Alert and cooperative. Normal mood and affect  ASSESSMENT AND PLAN;

## 2011-06-30 ENCOUNTER — Telehealth: Payer: Self-pay | Admitting: Internal Medicine

## 2011-06-30 ENCOUNTER — Other Ambulatory Visit: Payer: 59

## 2011-06-30 DIAGNOSIS — R197 Diarrhea, unspecified: Secondary | ICD-10-CM

## 2011-07-01 LAB — CLOSTRIDIUM DIFFICILE BY PCR: Toxigenic C. Difficile by PCR: NOT DETECTED

## 2011-07-01 NOTE — Telephone Encounter (Signed)
I spoke to the patient on her cell phone.  I asked how she was doing and she said she was feeling some better.  I let her know the stool test results for CDiff by PCR was negative, not detected.  She asked if she should continue the antibiotics and I told her absolutely , keep taking the anitibiotics. I asked her to please call me end of next week with a condition update.

## 2011-07-03 ENCOUNTER — Encounter: Payer: Self-pay | Admitting: Nurse Practitioner

## 2011-07-03 NOTE — Assessment & Plan Note (Addendum)
Need to rule out C-Difficile in setting of recent antibiotics. If stools are liquid will check C-Diff PCR and treat empirically with Flagyl. Continue Probiotics for 2-3 more weeks and call in next few days with condition update.

## 2011-07-04 NOTE — Progress Notes (Signed)
Reviewed and agree with management. [place] T. Izayiah Tibbitts MD FACG 

## 2011-07-05 ENCOUNTER — Encounter: Payer: Self-pay | Admitting: Internal Medicine

## 2011-07-14 ENCOUNTER — Telehealth: Payer: Self-pay | Admitting: Internal Medicine

## 2011-07-14 NOTE — Telephone Encounter (Signed)
Left message for patient to call back  

## 2011-07-14 NOTE — Telephone Encounter (Signed)
Patient is still having loose stool.  She is almost done with the Flagyl.  She is having alternating days of diarrhea and solid stools.  She will finish the flagyl this weekend.  Dr Leone Payor please advise

## 2011-07-14 NOTE — Telephone Encounter (Signed)
The full effects of metronidazole may not be seen for 1-2 weeks after completing. It does sound like it could be an IBS situation also.  As long as not having frequent stools in middle of night and she is making it to the bathroom in time - suggest she give it more time. She may go ahead and schedule a follow-up also - and cancel if not having problems.

## 2011-07-14 NOTE — Telephone Encounter (Signed)
Patient reports she is having a large amount of urgency and some incontinence.  She says she must be at the bathroom immediately when she gets the urge.  She will come in and see Dr. Leone Payor tomorrow at 10:45.  Her son is getting married this weekend and she is concerned about the urgency.

## 2011-07-15 ENCOUNTER — Encounter: Payer: Self-pay | Admitting: Internal Medicine

## 2011-07-15 ENCOUNTER — Ambulatory Visit (INDEPENDENT_AMBULATORY_CARE_PROVIDER_SITE_OTHER): Payer: 59 | Admitting: Internal Medicine

## 2011-07-15 VITALS — BP 108/72 | HR 80 | Ht 65.0 in | Wt 135.2 lb

## 2011-07-15 DIAGNOSIS — R197 Diarrhea, unspecified: Secondary | ICD-10-CM

## 2011-07-15 MED ORDER — RIFAXIMIN 550 MG PO TABS: 550.0000 mg | ORAL_TABLET | Freq: Three times a day (TID) | ORAL | Status: DC

## 2011-07-15 NOTE — Patient Instructions (Addendum)
Please go to the basement upon leaving today to have your labs done. Stop Metronidazole. It's ok to take Imodium every morning and as needed no more than 6 times a day. You have been given samples of Xifaxan to start 1 three times a day. Please give [place] a call back around the middle of next week to let us know how you are doing. You have been given a Low Fiber Low Residue Diet handout to read.

## 2011-07-15 NOTE — Progress Notes (Signed)
  Subjective:    Patient ID: Shari Gonzalez, female    DOB: Sep 19, 1951, 60 y.o.   MRN: [id number]  HPI this very pleasant lady developed acute diarrhea about 6 weeks after and left mastectomy. She saw the nurse practitioner here and was worked up for infectious diarrhea and treated empirically with metronidazole. Her C. difficile PCR was negative. She continues to have diarrhea up to 12 minute day, with urgent loose stools but then may go for a day without defecation. She has not had any normal bowel movements. There is crampy abdominal pain prior to defecation and sometimes is urgent. She has had small-volume fecal incontinence when she thought she was just passing flatus as well. No fevers. No nausea. Abdomen is overall sore.  She has not had problems like this before. She is on no new medications though she is taking a vitamin C supplement since her breast surgery. That's to help with hot flashes she has a tissue expander in her left breast and is preparing for breast reconstruction surgery.  She has some stress in her life in that her son is about to get married tomorrow. The breast cancer has been somewhat stressful as well. However oral she does not think these are bothering her that much. He has not noted any particular dietary problem. She has had some nocturnal stools though not much. She was using loperamide or Imodium prior to the antibiotics and  that will help.    Review of Systems As above    Objective:   Physical Exam Well-developed well-nourished no acute distress Abdomen soft mildly tender diffusely no organomegaly or mass bowel sounds are present       Assessment & Plan:

## 2011-07-15 NOTE — Assessment & Plan Note (Addendum)
Not resolved after a course of metronidazole. Overall this does sound infectious. Perhaps she has an antibiotic associated diarrhea but is not C. difficile. She is going to use some loperamide for control of symptoms, particularly over the weekend with her son's wedding. Will order a stool culture which will be done unless things normalize. I am going to give her Xifaxan 550 mg 3 times a day for 10 days which could cover undiagnosed C. difficile and many other possible infections including bacterial overgrowth though admittedly she is not a person at specific higher risk for bacterial overgrowth. I told her that if the [employer]man [employer] is to work it should start showing some improvement in about 5 days. If that is not the case and stool culture is unhelpful then a colonoscopy with possible random biopsies for chronic diarrhea will be needed. She is also advised to use a low-residue diet at this time the

## 2011-07-18 ENCOUNTER — Other Ambulatory Visit: Payer: 59

## 2011-07-18 ENCOUNTER — Encounter: Payer: Self-pay | Admitting: Internal Medicine

## 2011-07-18 DIAGNOSIS — R197 Diarrhea, unspecified: Secondary | ICD-10-CM

## 2011-07-18 NOTE — Telephone Encounter (Signed)
Error

## 2011-07-22 ENCOUNTER — Telehealth: Payer: Self-pay | Admitting: Internal Medicine

## 2011-07-22 LAB — STOOL CULTURE

## 2011-07-22 NOTE — Telephone Encounter (Signed)
Patient advised stool culture was negative.  She wanted to let Dr Leone Payor know that she is now having formed stools.  She is asked to call back for any concerns or problems.

## 2011-08-11 ENCOUNTER — Encounter (INDEPENDENT_AMBULATORY_CARE_PROVIDER_SITE_OTHER): Payer: Self-pay | Admitting: General Surgery

## 2011-08-31 ENCOUNTER — Telehealth: Payer: Self-pay | Admitting: Internal Medicine

## 2011-08-31 MED ORDER — DICYCLOMINE HCL 10 MG PO CAPS: ORAL_CAPSULE | ORAL | Status: DC

## 2011-08-31 NOTE — Telephone Encounter (Signed)
Patient reports that she is having some intermittent abdominal pain and diarrhea.  She is having these symptoms about once a week. She reported she had a very bad night last night and she is having a large amount of diarrhea this am.   She is concerned that her symptoms are returning from Oct.  She is having breast reconstruction surgery on 09/13/11.  She is concerned that she will have continued problems especially if they start her on antibiotics after surgery.  Dr Leone Payor please advise

## 2011-08-31 NOTE — Telephone Encounter (Signed)
Probably IBS and not infection  Please prescribe dicyclomine 20 mg 1/2 - 1 tab every 6 hours as needed for abdominal pain and diarrhea  #60 1 refill  Please remove metronidazole and xifaxan from med list

## 2011-08-31 NOTE — Telephone Encounter (Signed)
Patient advised.  She will call back for any further problems 

## 2011-09-30 ENCOUNTER — Encounter: Payer: Self-pay | Admitting: *Deleted

## 2011-09-30 NOTE — Progress Notes (Signed)
Pt had left breast removal of tissue expander and placement of gel implant and right breast augmentation on 09/13/11 by Dr. Etter Sjogren.

## 2011-10-28 ENCOUNTER — Telehealth: Payer: Self-pay | Admitting: Family Medicine

## 2011-10-28 NOTE — Telephone Encounter (Signed)
Tried to reach patient and  Find out what the patient wanted for traveling could not get and answer.

## 2011-10-28 NOTE — Telephone Encounter (Signed)
Patient does not want to come in for a visit and is not sick but says that she is experiencing tightness in nose and will be traveling next week and would love to have something for while she is traveling.  Please call patient back.

## 2011-10-31 NOTE — Telephone Encounter (Signed)
Left vm for pt to callback 

## 2011-11-01 MED ORDER — FLUTICASONE PROPIONATE 50 MCG/ACT NA SUSP: 2.0000 | Freq: Every day | NASAL | Status: DC

## 2011-11-01 NOTE — Telephone Encounter (Signed)
Patient notified as instructed by telephone. 

## 2011-11-01 NOTE — Telephone Encounter (Signed)
flonase is ok  Will refill electronically  F/u if not imp

## 2011-11-01 NOTE — Telephone Encounter (Signed)
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 Pt said she is taking Claritin and OTC12 hr nasal spray which helps slightly but since going to fly pt has tried Flonase before and would like rx of Flonase sent to [facility]. Pt can be reached at [phone].Please advise.

## 2012-02-10 ENCOUNTER — Other Ambulatory Visit: Payer: Self-pay | Admitting: Obstetrics & Gynecology

## 2012-02-10 DIAGNOSIS — Z1231 Encounter for screening mammogram for malignant neoplasm of breast: Secondary | ICD-10-CM

## 2012-02-24 ENCOUNTER — Telehealth: Payer: Self-pay | Admitting: *Deleted

## 2012-02-24 NOTE — Telephone Encounter (Signed)
Pt. Called.  Her estrogen had been stopped and she is on periden-c for hot flashes.   However, she still has vaginal dryness.  Her GYN suggested  "vagistem"  An estriadiol vaginal tablet - but wanted Dr. Renelda Loma advice first.  Suggested she try a vitamin E gel capsule vaginally nightly and then decrease to once or twice a week as sx improve.  If no improvement, call us back and will re-visit suggestion of estridiol.  Pt. Has no follow up with Dr. Donnie Coffin per POF 05/04/11.  Suggestion then to see Dr. Derrell Lolling on a yearly basis.   Pt. verbaizes understanding and is fine with this plan.

## 2012-03-01 ENCOUNTER — Ambulatory Visit
Admission: RE | Admit: 2012-03-01 | Discharge: 2012-03-01 | Disposition: A | Discharge status: Home / Self Care | Payer: 59 | Source: Ambulatory Visit | Attending: Obstetrics & Gynecology | Admitting: Obstetrics & Gynecology

## 2012-03-01 DIAGNOSIS — Z1231 Encounter for screening mammogram for malignant neoplasm of breast: Secondary | ICD-10-CM

## 2012-04-19 IMAGING — MG MM BREAST WIRE LOCALIZATION*L*
4 series · 4 of 4 positions shown · non-contrast
Comparison: none

CLINICAL DATA: Recent diagnosis of ductal carcinoma in situ in the
left breast.

[L LM (1 of 2)]
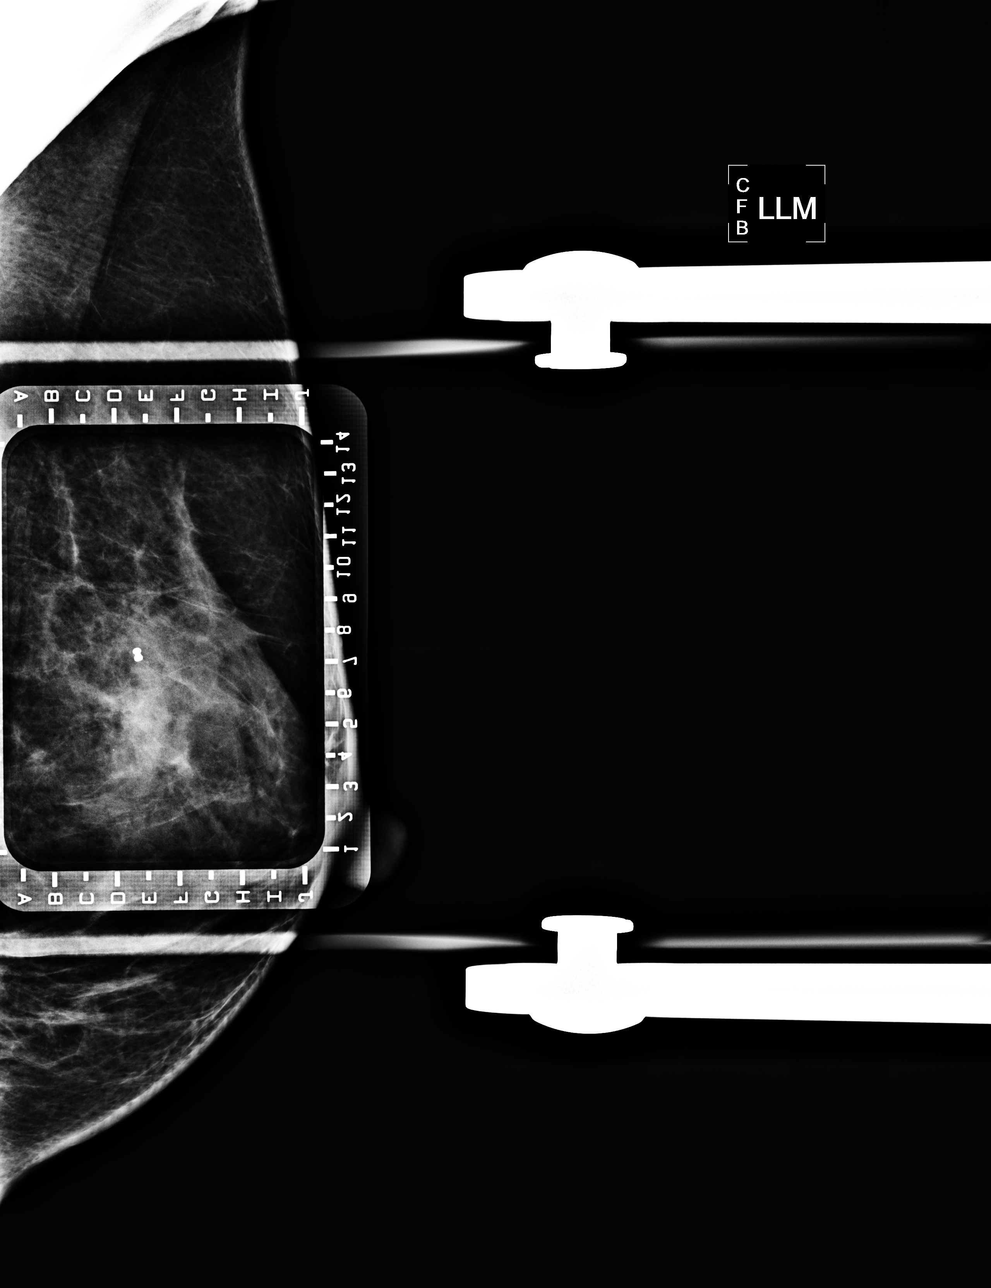

[L LM (2 of 2)]
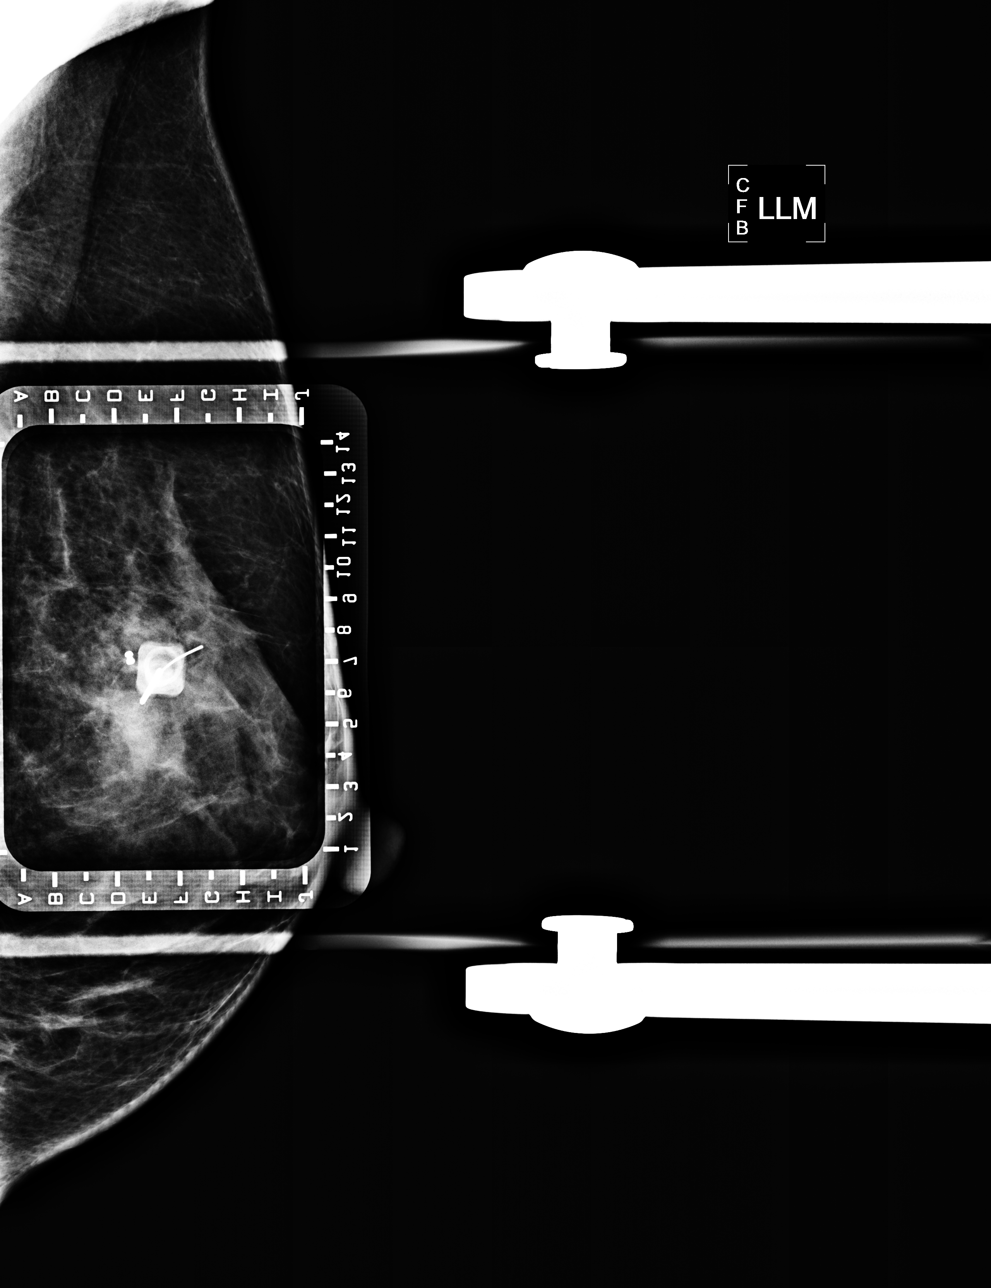

[L CC (1 of 2)]
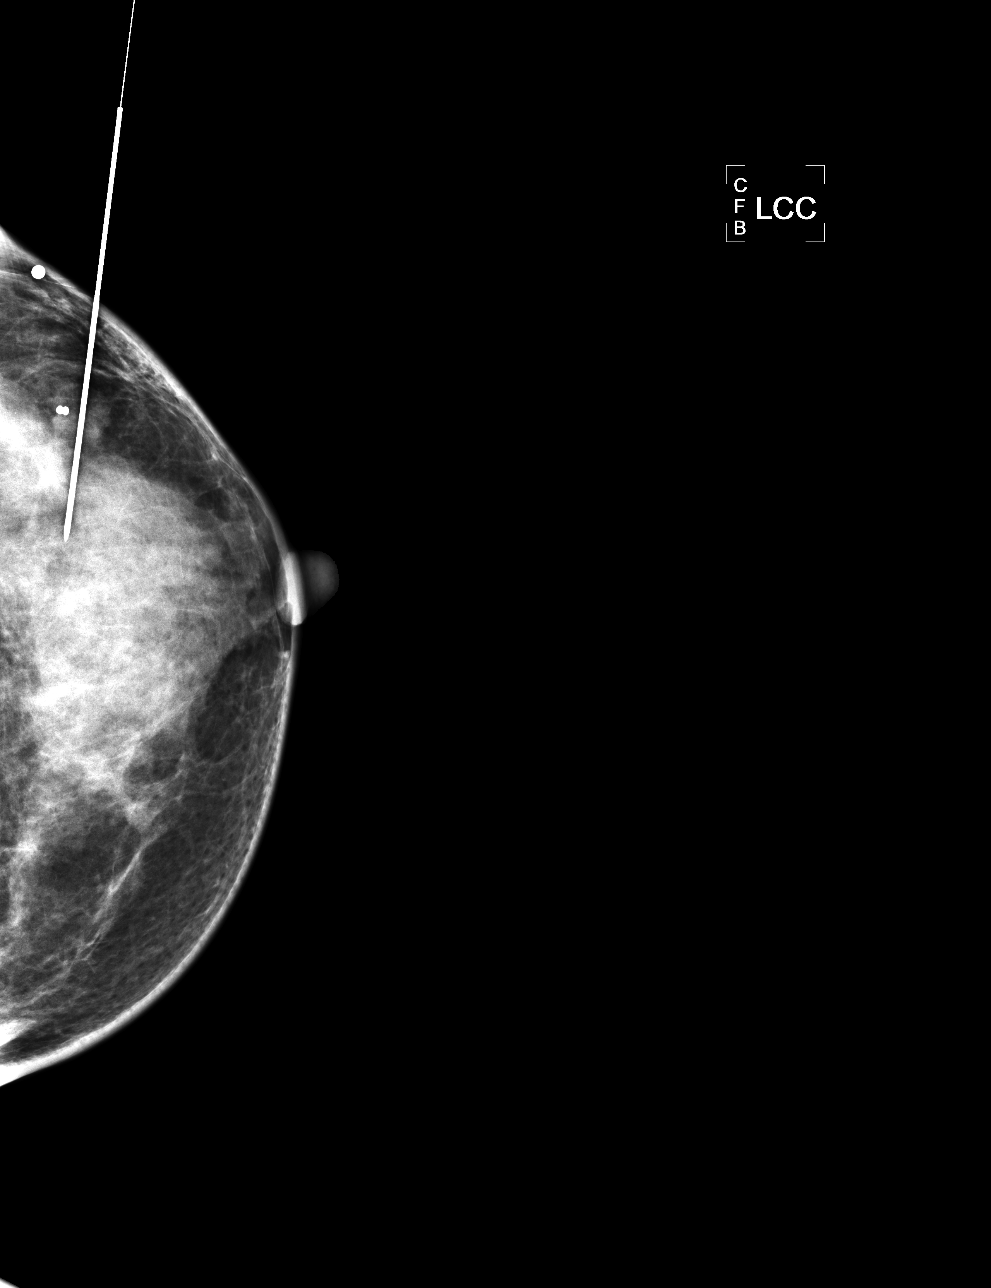

[L CC (2 of 2)]
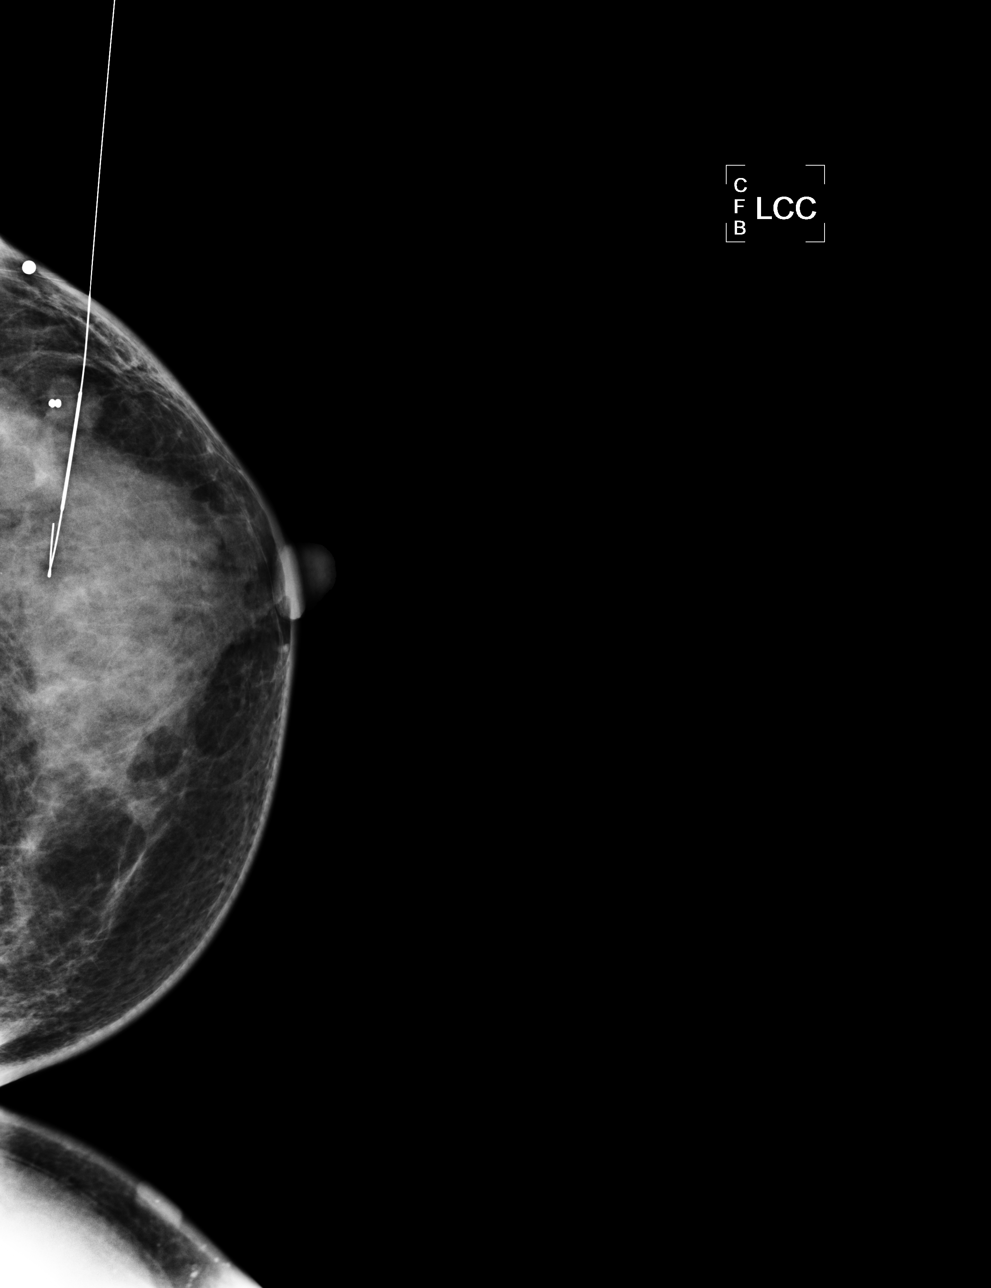

[4 of 4 positions shown; findings below may reference images not displayed]

LEFT BREAST NEEDLE LOCALIZATION WITH MAMMOGRAPHIC GUIDANCE AND
SPECIMEN RADIOGRAPH

Patient presents for needle localization prior to surgical
excision. The patient and I discussed the procedure of needle
localization including risks, benefits and alternatives.
Specifically, we discussed the risks of infection, bleeding and
inadequate sampling. Informed written consent was given.

Using mammographic guidance, sterile technique, 2% lidocaine and a
7 cm modified Kopans needle, the clip marking the biopsy site was
localized using a lateromedial approach. The needle was placed
slightly inferior to the clip on the lateromedial view to allow for
more accurate positioning relative to the remaining calcifications
within the breast.  Films were labeled and sent with the patient
surgery.  She tolerated the procedure well.

Specimen radiograph was performed at [HOSPITAL] Day [HOSPITAL],
and confirms the clip, wire and calcifications to be present in the
tissue sample.  The specimen is marked for pathology.
IMPRESSION: Needle localization left breast.  No apparent complications.

## 2012-05-22 ENCOUNTER — Ambulatory Visit (INDEPENDENT_AMBULATORY_CARE_PROVIDER_SITE_OTHER): Payer: 59 | Admitting: Family Medicine

## 2012-05-22 ENCOUNTER — Encounter: Payer: Self-pay | Admitting: Family Medicine

## 2012-05-22 VITALS — BP 122/74 | HR 64 | Temp 98.6°F | Wt 120.2 lb

## 2012-05-22 DIAGNOSIS — J019 Acute sinusitis, unspecified: Secondary | ICD-10-CM

## 2012-05-22 MED ORDER — AMOXICILLIN-POT CLAVULANATE 875-125 MG PO TABS: 1.0000 | ORAL_TABLET | Freq: Two times a day (BID) | ORAL | Status: AC

## 2012-05-22 MED ORDER — FLUTICASONE PROPIONATE 50 MCG/ACT NA SUSP: 2.0000 | Freq: Every day | NASAL | Status: DC

## 2012-05-22 NOTE — Patient Instructions (Signed)
You have a sinus infection. Take medicine as prescribed: augmentin twice daily for 10 days. Push fluids and plenty of rest. Nasal saline irrigation or neti pot to help drain sinuses. Restart flonase. Let us know if fever >101.5, trouble opening/closing mouth, difficulty swallowing, or worsening - you may need to be seen again.

## 2012-05-22 NOTE — Assessment & Plan Note (Signed)
Given duration and progression of sxs (acute worsening after initial improvement), will treat as acute bacterial rhinosinusitis with course of augmentin. See pt instructions for further care. Discussed red flags to return.

## 2012-05-22 NOTE — Progress Notes (Signed)
  Subjective:    Patient ID: Shari Gonzalez, female    DOB: 10-29-1950, 61 y.o.   MRN: [id number]  HPI CC: URTI?  8d h/o ST, that progressed to sneezing, aching.  Seemed to get better then recently worse.  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  Dark green mucous.  This morning with hoarse voice.  Has had low grade fever over last few days.  Feeling some pressure building up in sinuses.  Cough recently started.  Allergic to mucinex.  Taking pseudophed and ibuprofen for the last week.  Taking claritin.  Last sinus infection was 2 yrs ago.  Has run out of flonase.  No abd pain, n/v, ear or tooth pain, HA, SOB, wheezing.  + sick contacts at work.  No smokers at home.  No h/o asthma, COPD.  Review of Systems Per HPI    Objective:   Physical Exam  Nursing note and vitals reviewed. Constitutional: She appears well-developed and well-nourished. No distress.  HENT:  Head: Normocephalic and atraumatic.  Right Ear: Hearing, tympanic membrane, external ear and ear canal normal.  Left Ear: Hearing, tympanic membrane, external ear and ear canal normal.  Nose: Mucosal edema present. No rhinorrhea. Right sinus exhibits maxillary sinus tenderness. Right sinus exhibits no frontal sinus tenderness. Left sinus exhibits no maxillary sinus tenderness and no frontal sinus tenderness.  Mouth/Throat: Uvula is midline, oropharynx is clear and moist and mucous membranes are normal. No oropharyngeal exudate, posterior oropharyngeal edema, posterior oropharyngeal erythema or tonsillar abscesses.  Eyes: Conjunctivae and EOM are normal. Pupils are equal, round, and reactive to light. No scleral icterus.  Neck: Normal range of motion. Neck supple.  Cardiovascular: Normal rate, regular rhythm, normal heart sounds and intact distal pulses.   No murmur heard. Pulmonary/Chest: Effort normal and breath sounds normal. No respiratory distress. She has no wheezes. She has no rales.  Musculoskeletal: She exhibits  no edema.  Lymphadenopathy:    She has no cervical adenopathy.  Skin: Skin is warm and dry. No rash noted.       Assessment & Plan:

## 2012-06-26 ENCOUNTER — Telehealth: Payer: Self-pay | Admitting: Family Medicine

## 2012-06-26 NOTE — Telephone Encounter (Signed)
I agree she needs to be seen.

## 2012-06-26 NOTE — Telephone Encounter (Signed)
Caller: Cordelia/Patient; Patient Name: Shari Gonzalez; PCP: Roxy Mann[facility]); Best Callback Phone Number: [phone]; Saw a provider with practice about a month ago-had a respiratory infection that started viral, but went bacterial. Note in Epic states 05/22/12-states she felt better within a week of antibiotics.   Symptoms resolved.  Returned on 06/21/12 starting with sore throat, progressed to aching with progressive congestion in head.  06/25/12 felt feverish, but not as much today.  Intake and output normal for patient.  Feels tired. Got up this morning with eyes red and gritty- no drainage-used eye drops and is better. Denies chest congestion, drainage clear.  This is second round of respiratory symptoms within a month.  Triaged using upper respiratory infection with a disposition to see provider within 24 hours due to new onset of red eyes with gritty sensation and see in 2 weeks for recent or recurrent episodes of allergy like symptoms. Care advice reviewed.  Offered appointment with Dr. Georgette Shell today, but patient's work schedule will not allow her to come.  Will call back in the morning for appointment if needed.  Patient demonstrated understanding of information given.

## 2013-02-13 ENCOUNTER — Other Ambulatory Visit: Payer: Self-pay

## 2013-02-13 DIAGNOSIS — Z9012 Acquired absence of left breast and nipple: Secondary | ICD-10-CM

## 2013-02-13 DIAGNOSIS — Z1231 Encounter for screening mammogram for malignant neoplasm of breast: Secondary | ICD-10-CM

## 2013-03-13 ENCOUNTER — Ambulatory Visit
Admission: RE | Admit: 2013-03-13 | Discharge: 2013-03-13 | Disposition: A | Discharge status: Home / Self Care | Payer: 59 | Source: Ambulatory Visit

## 2013-03-13 DIAGNOSIS — Z1231 Encounter for screening mammogram for malignant neoplasm of breast: Secondary | ICD-10-CM

## 2013-03-13 DIAGNOSIS — Z9012 Acquired absence of left breast and nipple: Secondary | ICD-10-CM

## 2013-03-14 ENCOUNTER — Telehealth: Payer: Self-pay

## 2013-03-14 ENCOUNTER — Ambulatory Visit: Payer: 59

## 2013-03-14 NOTE — Telephone Encounter (Signed)
Agreed -

## 2013-03-14 NOTE — Telephone Encounter (Signed)
Since first of week pt has S/T, laryngitis, achy, sneezing, non prod cough and fever started today. Pt has to be careful taking antibiotics due to irritating the colon. Pt scheduled appt to see Dr Ermalene Searing 03/15/13 at 2:15. If pt condition worsens or changes prior to appt pt will go to [facility].

## 2013-03-15 ENCOUNTER — Encounter: Payer: Self-pay | Admitting: Family Medicine

## 2013-03-15 ENCOUNTER — Ambulatory Visit (INDEPENDENT_AMBULATORY_CARE_PROVIDER_SITE_OTHER): Payer: 59 | Admitting: Family Medicine

## 2013-03-15 VITALS — BP 112/76 | HR 91 | Temp 100.0°F | Wt 128.8 lb

## 2013-03-15 DIAGNOSIS — J069 Acute upper respiratory infection, unspecified: Secondary | ICD-10-CM

## 2013-03-15 DIAGNOSIS — J029 Acute pharyngitis, unspecified: Secondary | ICD-10-CM

## 2013-03-15 LAB — POCT RAPID STREP A (OFFICE): Rapid Strep A Screen: NEGATIVE

## 2013-03-15 MED ORDER — AZITHROMYCIN 250 MG PO TABS: ORAL_TABLET | ORAL | Status: DC

## 2013-03-15 MED ORDER — HYDROCODONE-HOMATROPINE 5-1.5 MG/5ML PO SYRP: 2.5000 mL | ORAL_SOLUTION | Freq: Four times a day (QID) | ORAL | Status: DC | PRN

## 2013-03-15 NOTE — Assessment & Plan Note (Signed)
Symptomatic care but if not improving fill Rx for antibiotics after 3-4 days.

## 2013-03-15 NOTE — Patient Instructions (Addendum)
Ibuprofen for HA. Nasal saline irrigation 2-3 times  A day. Cough suppressant at night as needed. If not improving in next 3-4 days, then fill prescription for antibiotics.

## 2013-03-15 NOTE — Progress Notes (Signed)
  Subjective:    Patient ID: Shari Gonzalez, female    DOB: 1950/10/17, 62 y.o.   MRN: [id number]  Sore Throat  This is a new problem. The current episode started in the past 7 days. The maximum temperature recorded prior to her arrival was 102 - 102.9 F. Associated symptoms include coughing, ear pain and swollen glands. Pertinent negatives include no ear discharge, headaches or shortness of breath. Associated symptoms comments: Lost voice earlier in week. She has had no exposure to strep or mono. Exposure to: no sick contacts.. She has tried NSAIDs for the symptoms. The treatment provided mild relief.  Cough This is a new problem. The current episode started in the past 7 days (in last 2-3 days). The problem has been gradually worsening. The problem occurs constantly. The cough is productive of sputum. Associated symptoms include ear pain, a fever, myalgias, nasal congestion and sweats. Pertinent negatives include no headaches, shortness of breath or wheezing. Associated symptoms comments: Trouble sleeping at night. Risk factors: NONSMOKER. She has tried OTC cough suppressant for the symptoms. Her past medical history is significant for environmental allergies. There is no history of asthma, COPD, emphysema or pneumonia.      Review of Systems  Constitutional: Positive for fever.  HENT: Positive for ear pain. Negative for ear discharge.   Respiratory: Positive for cough. Negative for shortness of breath and wheezing.   Musculoskeletal: Positive for myalgias.  Allergic/Immunologic: Positive for environmental allergies.  Neurological: Negative for headaches.       Objective:   Physical Exam  Constitutional: Vital signs are normal. She appears well-developed and well-nourished. She is cooperative.  Non-toxic appearance. She does not appear ill. No distress.  HENT:  Head: Normocephalic.  Right Ear: Hearing, tympanic membrane, external ear and ear canal normal. Tympanic membrane is not  erythematous, not retracted and not bulging.  Left Ear: Hearing, tympanic membrane, external ear and ear canal normal. Tympanic membrane is not erythematous, not retracted and not bulging.  Nose: Mucosal edema and rhinorrhea present. Right sinus exhibits no maxillary sinus tenderness and no frontal sinus tenderness. Left sinus exhibits no maxillary sinus tenderness and no frontal sinus tenderness.  Mouth/Throat: Uvula is midline, oropharynx is clear and moist and mucous membranes are normal.  Eyes: Conjunctivae, EOM and lids are normal. Pupils are equal, round, and reactive to light. No foreign bodies found.  Neck: Trachea normal and normal range of motion. Neck supple. Carotid bruit is not present. No mass and no thyromegaly present.  Cardiovascular: Normal rate, regular rhythm, S1 normal, S2 normal, normal heart sounds, intact distal pulses and normal pulses.  Exam reveals no gallop and no friction rub.   No murmur heard. Pulmonary/Chest: Effort normal and breath sounds normal. Not tachypneic. No respiratory distress. She has no decreased breath sounds. She has no wheezes. She has no rhonchi. She has no rales.  Neurological: She is alert.  Skin: Skin is warm, dry and intact. No rash noted.  Psychiatric: Her speech is normal and behavior is normal. Judgment normal. Her mood appears not anxious. Cognition and memory are normal. She does not exhibit a depressed mood.          Assessment & Plan:

## 2013-03-19 ENCOUNTER — Telehealth: Payer: Self-pay

## 2013-03-19 NOTE — Telephone Encounter (Signed)
Pt left v/m Dr Jennette Kettle sent lab results to Dr Milinda Antis; pt wants to know if pt should f/u with Dr Milinda Antis due to some of test results being high.Please advise.

## 2013-03-25 NOTE — Telephone Encounter (Signed)
Pt returned my call, advise of lab results and f/u appt scheduled per pt request to discuss thyroid issues

## 2013-03-25 NOTE — Telephone Encounter (Signed)
Dr. Milinda Antis sent me lab results and her comment was "These labs look ok to me but if she is not feeling well have her f/u please", called pt and left voicemail requesting pt to call office

## 2013-04-01 ENCOUNTER — Encounter: Payer: Self-pay | Admitting: Obstetrics & Gynecology

## 2013-04-08 ENCOUNTER — Encounter: Payer: Self-pay | Admitting: Family Medicine

## 2013-04-08 ENCOUNTER — Ambulatory Visit (INDEPENDENT_AMBULATORY_CARE_PROVIDER_SITE_OTHER): Payer: 59 | Admitting: Family Medicine

## 2013-04-08 VITALS — BP 122/78 | HR 81 | Temp 98.2°F | Ht 65.0 in | Wt 128.0 lb

## 2013-04-08 DIAGNOSIS — R141 Gas pain: Secondary | ICD-10-CM

## 2013-04-08 DIAGNOSIS — R5383 Other fatigue: Secondary | ICD-10-CM

## 2013-04-08 DIAGNOSIS — R5381 Other malaise: Secondary | ICD-10-CM

## 2013-04-08 DIAGNOSIS — R143 Flatulence: Secondary | ICD-10-CM

## 2013-04-08 DIAGNOSIS — R140 Abdominal distension (gaseous): Secondary | ICD-10-CM | POA: Insufficient documentation

## 2013-04-08 NOTE — Assessment & Plan Note (Addendum)
Suspect multifactorial -in combo with low libido /hx of breast ca on anti estrogen agents/ and struggle to avoid wt gain / recent severe stressors Lab today Disc expectations for menopause  Disc healthy meals and preoccupation with need to loose wt (nl bmi) which is worrisome Disc role of counseling for stress rxn if needed  >25 min spent with face to face with patient, >50% counseling and/or coordinating care

## 2013-04-08 NOTE — Assessment & Plan Note (Signed)
Pt sounds to have IBS symptoms and is less tolerant of some foods  Disc healthy diet and log of foods non tolerant of  May f/u with GI if not improved

## 2013-04-08 NOTE — Patient Instructions (Addendum)
Your thyroid tests are normal today- that is reassuring  Lab today for fatigue  Some of your symptoms may be related to hormone change and let down from stress  See your GI doctor if symptoms continue

## 2013-04-08 NOTE — Progress Notes (Signed)
Subjective:    Patient ID: Shari Gonzalez, female    DOB: [birth year], 62 y.o.   MRN: [id number]  HPI Here to disc labs done at her gyn office (Dr Varney Baas)  Doing ok but having some symptoms She is really tired  Has constipation  In the past she had a really hard time trying to loose weight  No thyroid problem in the past  Thinks her father has had hyperthyroidism   She does have vaginal atrophy and dryness   Has had some ongoing bowel changes  Was on strict diet for a while - was better Now is introducing back some foods -and more gas/ etc  Due colonosc in 2016  Now is very careful with what she eats - and she thinks her wt is trying to creep back up  She retired recently Stressors- father 96, building a house, new grandchild born early  Things are finally getting back to normal    (at at time she may have gone through a bout of depression- and she thinks that is better)   Low libido  Has had hysterectomy Was on med after breast cancer that caused menopause   Wt is stable with bmi of 21    T4 is 6.9 (nl) T3 uptake is slt elevated at 40.9 Tsh nl at 3.03 Free T4 is nl at 1.01 Free thyroxine index is nl at 2.8  Not on any meds   No hx of liver or kidney dz or malnutrition  (but was previously on a very strict low calorie diet in attempt to lose wt) Aspirin products-none abx -none   Patient Active Problem List   Diagnosis Date Noted  . Other malaise and fatigue 04/08/2013  . Bloating 04/08/2013   Past Medical History  Diagnosis Date  . Allergy     seasonal  . Breast cancer    Past Surgical History  Procedure Laterality Date  . Abdominal hysterectomy      complete with BSO  . Mastectomy  July 2012    bilateral mastectomy   History  Substance Use Topics  . Smoking status: Never Smoker   . Smokeless tobacco: Never Used  . Alcohol Use: 1.2 oz/week    2 Glasses of wine per week     Comment: Occasional   Family History  Problem Relation Age of  Onset  . Alzheimer's disease Mother   . Kidney cancer Father   . Lung cancer Father   . Gout Father   . Diabetes Paternal Grandmother   . Breast cancer Maternal Aunt    Allergies  Allergen Reactions  . Dilaudid (Hydromorphone Hcl) Rash  . Guaifenesin Er Rash   Current Outpatient Prescriptions on File Prior to Visit  Medication Sig Dispense Refill  . fluticasone (FLONASE) 50 MCG/ACT nasal spray Place 2 sprays into the nose as needed.      Marland Kitchen HYDROcodone-homatropine (HYCODAN) 5-1.5 MG/5ML syrup Take 2.5-5 mLs by mouth every 6 (six) hours as needed for cough.  180 mL  0  . loratadine (CLARITIN) 10 MG tablet Take 10 mg by mouth as needed.       . Multiple Vitamins-Minerals (MULTIVITAMIN WITH MINERALS) tablet Take 1 tablet by mouth daily.        . Probiotic Product (PROBIOTIC DAILY PO) Take by mouth as directed.      . pyridOXINE (VITAMIN B-6) 100 MG tablet Take 100 mg by mouth daily.       No current facility-administered medications on file prior to  visit.    Review of Systems Review of Systems  Constitutional: Negative for fever, appetite change,  and unexpected weight change. pos for fatigue  Eyes: Negative for pain and visual disturbance.  Respiratory: Negative for cough and shortness of breath.   Cardiovascular: Negative for cp or palpitations    Gastrointestinal: Negative for nausea, diarrhea and constipation. pos for bloating today (occ diarrhea) Genitourinary: Negative for urgency and frequency. pos for reduced libido  Skin: Negative for pallor or rash   Neurological: Negative for weakness, light-headedness, numbness and headaches. pos for hot flashes Hematological: Negative for adenopathy. Does not bruise/bleed easily.  Psychiatric/Behavioral: Negative for dysphoric mood. The patient is not nervous/anxious.         Objective:   Physical Exam  Constitutional: She appears well-developed and well-nourished. No distress.  HENT:  Head: Normocephalic and atraumatic.   Mouth/Throat: Oropharynx is clear and moist.  Eyes: Conjunctivae and EOM are normal. Pupils are equal, round, and reactive to light. Right eye exhibits no discharge. Left eye exhibits no discharge. No scleral icterus.  Neck: Normal range of motion. Neck supple. No JVD present. Carotid bruit is not present. No thyromegaly present.  Cardiovascular: Normal rate, regular rhythm, normal heart sounds and intact distal pulses.  Exam reveals no gallop.   Pulmonary/Chest: Breath sounds normal. No respiratory distress. She has no wheezes. She has no rales.  Abdominal: Soft. Bowel sounds are normal. She exhibits no distension, no abdominal bruit and no mass. There is no tenderness.  Musculoskeletal: She exhibits no edema and no tenderness.  Lymphadenopathy:    She has no cervical adenopathy.  Neurological: She is alert. She has normal reflexes. No cranial nerve deficit. She exhibits normal muscle tone. Coordination normal.  Skin: Skin is warm and dry. No rash noted. No erythema. No pallor.  Psychiatric: She has a normal mood and affect.  Pt seems generally frustrated           Assessment & Plan:

## 2013-04-09 LAB — COMPREHENSIVE METABOLIC PANEL
ALT: 16 U/L (ref 0–35)
AST: 17 U/L (ref 0–37)
CO2: 27 mEq/L (ref 19–32)
Calcium: 9.4 mg/dL (ref 8.4–10.5)
Chloride: 104 mEq/L (ref 96–112)
GFR: 100.42 mL/min (ref >60.00)
Potassium: 3.9 mEq/L (ref 3.5–5.1)
Sodium: 140 mEq/L (ref 135–145)
Total Protein: 7.1 g/dL (ref 6.0–8.3)

## 2013-04-09 LAB — CBC WITH DIFFERENTIAL/PLATELET
Basophils Absolute: 0 10*3/uL (ref 0.0–0.1)
Eosinophils Absolute: 0.1 10*3/uL (ref 0.0–0.7)
Hemoglobin: 13.2 g/dL (ref 12.0–15.0)
Lymphocytes Relative: 22 % (ref 12.0–46.0)
Monocytes Relative: 7.3 % (ref 3.0–12.0)
Neutrophils Relative %: 67.6 % (ref 43.0–77.0)
Platelets: 168 10*3/uL (ref 150.0–400.0)
RDW: 14.1 % (ref 11.5–14.6)

## 2013-04-10 ENCOUNTER — Encounter: Payer: Self-pay | Admitting: *Deleted

## 2013-11-27 ENCOUNTER — Other Ambulatory Visit: Payer: Self-pay | Admitting: Dermatology

## 2013-12-12 ENCOUNTER — Other Ambulatory Visit: Payer: Self-pay | Admitting: Physician Assistant

## 2014-02-11 ENCOUNTER — Other Ambulatory Visit: Payer: Self-pay | Admitting: Obstetrics & Gynecology

## 2014-02-11 DIAGNOSIS — Z1231 Encounter for screening mammogram for malignant neoplasm of breast: Secondary | ICD-10-CM

## 2014-02-11 DIAGNOSIS — Z9010 Acquired absence of unspecified breast and nipple: Secondary | ICD-10-CM

## 2014-02-11 DIAGNOSIS — Z853 Personal history of malignant neoplasm of breast: Secondary | ICD-10-CM

## 2014-03-17 ENCOUNTER — Ambulatory Visit
Admission: RE | Admit: 2014-03-17 | Discharge: 2014-03-17 | Disposition: A | Discharge status: Home / Self Care | Payer: 59 | Source: Ambulatory Visit | Attending: Obstetrics & Gynecology | Admitting: Obstetrics & Gynecology

## 2014-03-17 DIAGNOSIS — Z9010 Acquired absence of unspecified breast and nipple: Secondary | ICD-10-CM

## 2014-03-17 DIAGNOSIS — Z853 Personal history of malignant neoplasm of breast: Secondary | ICD-10-CM

## 2014-03-17 DIAGNOSIS — Z1231 Encounter for screening mammogram for malignant neoplasm of breast: Secondary | ICD-10-CM

## 2014-03-18 ENCOUNTER — Encounter: Payer: Self-pay | Admitting: Physician Assistant

## 2014-03-18 NOTE — Progress Notes (Unsigned)
I was contacted this morning by the patient's gynecologist, Dr. Evette Cristal.  The patient was previously followed through our office for breast cancer, and was seen by Dr. Eston Esters. It looks as if she has not been seen since approximately June of 2012 in this office, and it appears as if the plan at that time was for her to followup with Dr. Dalbert Batman on an annual basis. The most recent office note I see from Dr. Dalbert Batman was from 04/26/2011.   Fortunately, the patient has no active problems at this time. Dr. Nori Riis has requested that she be seen in our clinic for routine followup, and has requested that she be seen by Dr. Jana Hakim.  This information will be given to our breast navigators for further investigation.    Micah Flesher, PA-C 03/18/2014

## 2014-03-19 ENCOUNTER — Encounter: Payer: Self-pay | Admitting: *Deleted

## 2014-03-19 NOTE — Progress Notes (Signed)
Shari Gonzalez Found PA, brought to my attention Dr. Kara Mead request for f/u. Pt was released from medical oncology from 05/04/11 due to pt not wanting to take AI and she had bilateral mastectomies. Per Dr. Truddie Coco the pt did not need f/u. I did inform Dr. Dalbert Batman and his nurse that the pt would need f/u with him. She is currently getting scheduled to see Dr. Dalbert Batman.    I called Dr. Nori Riis and spoke to his nurse Maudie Mercury and informed of her release from medical oncology and the need for the pt to see Dr. Dalbert Batman.  Maudie Mercury will relay the message to Dr. Nori Riis.

## 2014-04-14 ENCOUNTER — Ambulatory Visit (INDEPENDENT_AMBULATORY_CARE_PROVIDER_SITE_OTHER): Payer: 59 | Admitting: General Surgery

## 2014-04-14 ENCOUNTER — Encounter (INDEPENDENT_AMBULATORY_CARE_PROVIDER_SITE_OTHER): Payer: Self-pay | Admitting: General Surgery

## 2014-04-14 VITALS — BP 112/62 | HR 60 | Temp 97.2°F | Resp 14 | Ht 65.0 in | Wt 122.8 lb

## 2014-04-14 DIAGNOSIS — Z853 Personal history of malignant neoplasm of breast: Secondary | ICD-10-CM | POA: Insufficient documentation

## 2014-04-14 NOTE — Patient Instructions (Signed)
Examination of your right breast, left mastectomy reconstruction, and all the regional lymph nodes is normal. Your right breast mammogram last month was normal. There is no evidence of cancer.  I agree with Dr. Dory Horn and the referral back to medical oncology. Be sure to see one of the medical oncologist sometime in the next 3 months or so  Return to see Dr. Dalbert Batman in one year after your annual mammograms.

## 2014-04-14 NOTE — Progress Notes (Signed)
Patient ID: Shari Gonzalez, female   DOB: 1951/05/24, 63 y.o.   MRN: [id number] History: This patient returns to reestablish regarding her history of left breast cancer for long-term followup. Her PCP is Dr. Glori Gonzalez. Her gynecologist is Dr. Dory Gonzalez. Her oncologist was Dr. Truddie Gonzalez. Her plastic surgeon is Dr. Crissie Gonzalez. This patient originally underwent left partial mastectomy and sentinel lymph node biopsy for receptor positive ductal carcinoma in situ. She had multifocal positive margins. She then underwent left total mastectomy and placement of tissue expander in 2012. She had no residual cancer. She saw Dr. Truddie Gonzalez and declined antiestrogen therapy. She now has a permanent implant on the left,  nipple reconstruction, and a implant on the right that she thinks is subglandular. She has no new problems and does not know why she was lost to followup. She states that Dr. Dory Gonzalez told her she needed to be followed regularly and that she is going to be referred to one of the medical oncologists at the [facility] for further medical oncology consultation. She has no complaints about her breasts. She is pleased with her plastic surgical reconstruction Right breast mammogram on 03/19/2014 is category 1. No abnormality  Past history is reviewed, unchanged, and noncontributory except as described above. Family history reveals breast cancer in a maternal aunt and a maternal second cousin. There's no family history of prostate cancer or ovarian cancer  10 system review of systems is performed and is negative except as described above   Exam: Pleasant woman. Thin.  Appears fit for  her age. No distress. Neck reveals no adenopathy or mass Lungs clear to auscultation bilaterally Heart regular rate and rhythm no murmur no ectopy This exam reveals that the right breast and left breast reconstruction are symmetrical with good symmetry good contour. There is a transverse incision on the left and a nipple  reconstruction. The skin is otherwise normal without nodules. The left axilla feels normal. Right breast reveals a transverse inframammary incision implant in place. Skin and breast tissue and nipple feel normal. Right axilla is normal.  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 No evidence of recurrence 3 years postop Status post augmentation mammoplasty right breast  Plan:   at her request, I will see her annually after she gets her right breast mammogram   agree with referral to medical oncology, and reconsideration of antiestrogen therapy benefits   Shari Gonzalez M. Dalbert Batman, M.D., [facility] General and Minimally invasive Surgery Breast and Colorectal Surgery Office:   [phone] Pager:   [phone]

## 2014-04-28 ENCOUNTER — Other Ambulatory Visit: Payer: Self-pay | Admitting: Oncology

## 2014-04-28 DIAGNOSIS — C50919 Malignant neoplasm of unspecified site of unspecified female breast: Secondary | ICD-10-CM

## 2014-04-30 ENCOUNTER — Telehealth: Payer: Self-pay | Admitting: Oncology

## 2014-04-30 NOTE — Telephone Encounter (Signed)
Lvm advising appt 9/3 @ 4pm. Also mailed appt calendar.

## 2014-05-29 ENCOUNTER — Other Ambulatory Visit (HOSPITAL_BASED_OUTPATIENT_CLINIC_OR_DEPARTMENT_OTHER): Payer: 59

## 2014-05-29 ENCOUNTER — Ambulatory Visit (HOSPITAL_BASED_OUTPATIENT_CLINIC_OR_DEPARTMENT_OTHER): Payer: 59 | Admitting: Oncology

## 2014-05-29 VITALS — BP 113/61 | HR 66 | Temp 98.4°F | Resp 18 | Ht 65.0 in | Wt 124.5 lb

## 2014-05-29 DIAGNOSIS — D0590 Unspecified type of carcinoma in situ of unspecified breast: Secondary | ICD-10-CM

## 2014-05-29 DIAGNOSIS — C50912 Malignant neoplasm of unspecified site of left female breast: Secondary | ICD-10-CM | POA: Insufficient documentation

## 2014-05-29 DIAGNOSIS — Z170 Estrogen receptor positive status [ER+]: Secondary | ICD-10-CM

## 2014-05-29 DIAGNOSIS — C50919 Malignant neoplasm of unspecified site of unspecified female breast: Secondary | ICD-10-CM

## 2014-05-29 LAB — CBC WITH DIFFERENTIAL/PLATELET
BASO%: 0.8 % (ref 0.0–2.0)
Basophils Absolute: 0 10*3/uL (ref 0.0–0.1)
EOS%: 2.3 % (ref 0.0–7.0)
Eosinophils Absolute: 0.1 10*3/uL (ref 0.0–0.5)
HCT: 39.7 % (ref 34.8–46.6)
HGB: 13.2 g/dL (ref 11.6–15.9)
LYMPH%: 27.1 % (ref 14.0–49.7)
MCH: 30.2 pg (ref 25.1–34.0)
MCHC: 33.3 g/dL (ref 31.5–36.0)
MCV: 90.8 fL (ref 79.5–101.0)
MONO#: 0.3 10*3/uL (ref 0.1–0.9)
MONO%: 5.8 % (ref 0.0–14.0)
NEUT#: 2.8 10*3/uL (ref 1.5–6.5)
NEUT%: 64 % (ref 38.4–76.8)
Platelets: 159 10*3/uL (ref 145–400)
RBC: 4.38 10*6/uL (ref 3.70–5.45)
RDW: 13.9 % (ref 11.2–14.5)
WBC: 4.4 10*3/uL (ref 3.9–10.3)
lymph#: 1.2 10*3/uL (ref 0.9–3.3)

## 2014-05-29 LAB — COMPREHENSIVE METABOLIC PANEL (CC13)
ALBUMIN: 4.2 g/dL (ref 3.5–5.0)
ALK PHOS: 75 U/L (ref 40–150)
ALT: 17 U/L (ref 0–55)
AST: 18 U/L (ref 5–34)
Anion Gap: 9 mEq/L (ref 3–11)
BUN: 17.3 mg/dL (ref 7.0–26.0)
CO2: 28 mEq/L (ref 22–29)
Calcium: 9.3 mg/dL (ref 8.4–10.4)
Chloride: 105 mEq/L (ref 98–109)
Creatinine: 0.8 mg/dL (ref 0.6–1.1)
Glucose: 130 mg/dl (ref 70–140)
POTASSIUM: 3.5 meq/L (ref 3.5–5.1)
Sodium: 142 mEq/L (ref 136–145)
TOTAL PROTEIN: 6.7 g/dL (ref 6.4–8.3)
Total Bilirubin: 0.56 mg/dL (ref 0.20–1.20)

## 2014-05-29 NOTE — Progress Notes (Signed)
[place]  [phone] Fax:(336) [phone]     ID: Shari Gonzalez DOB: [birth year]  MR#: [id number]  MVE#:[id number]  PCP: Loura Pardon, MD GYN: Evette Cristal SU: Fanny Skates OTHER MD: Crissie Reese   CHIEF COMPLAINT: Ductal carcinoma in situ  CURRENT TREATMENT: Observation   BREAST CANCER HISTORY: From Dr. Cena Benton Rubin's original intake note:  "Ms. Serviss has been in good health.  She undergoes annual screening mammography.  She underwent a screening mammogram 02/16/2011, which suggested an area of microcalcification.  Diagnostic left mammogram 03/03/2011 showed a 1.5 cm area of scattered microcalcifications upper outer left breast.  Biopsy was recommended.  Biopsy was on 03/07/2011.  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  The patient has undergone MRI scan of both breasts on 03/16/2011 that showed an area measuring 0.7 x 2 x 1.5 cm area of linear enhancement to the left breast.  No other abnormalities were seen.  The patient is here for discussion prior to seeing Dr. Dalbert Batman again.  She is here for followup after lumpectomy.  The lumpectomy took place on 03/14/2011 and showed high-grade DCIS with calcifications, comedo necrosis and __________ superior, anterior margins __________ margin, 0.4 cm from medial margin.  Re-excision of these areas showed some close margins.  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  The final pathology did show multiple foci of in situ carcinoma, the largest measuring greater than 0.8 cm."  Her subsequent history as detailed below  INTERVAL HISTORY: Shari Gonzalez returns today for followup of her noninvasive breast cancer. After her initial visit with Dr. Truddie Coco, she was released to her gynecologist and her plastic surgeon, Dr. Harlow Mares. However Dr. Harlow Mares released her last year. [name] was feeling a    Little lost, wondering whether she needed followup or not for her breast cancer, and also worrying that perhaps some did of cancer tissue might have been left behind her implant and could be growing there were no one has access to it. She also wonders if we have some resources that may give her more information regarding the cancer.  ROS: She has some problems associated with menopause, namely insomnia, hot flashes, and vaginal dryness issues. She has occasional muscle aches and cramps, but nothing particularly persistent or worrisome. Detailed review of systems today was otherwise noncontributory  PAST MEDICAL HISTORY: Past Medical History  Diagnosis Date  . Allergy     seasonal  . Breast cancer     PAST SURGICAL HISTORY: Past Surgical History  Procedure Laterality Date  . Abdominal hysterectomy      complete with BSO  . Mastectomy  July 2012    bilateral mastectomy  . Breast reconstruction with placement of tissue expander and flex hd (acellular hydrated dermis)      FAMILY HISTORY Family History  Problem Relation Age of Onset  . Alzheimer's disease Mother   . Kidney cancer Father   . Lung cancer Father   . Gout Father   . Diabetes Paternal Grandmother   . Breast cancer Maternal Aunt    the patient's father is alive at age 62. He has a history of renal cell carcinoma age 16 and lung cancer 76. He was a smoker. The patient's mother died at age 16 with Alzheimer's disease. The patient one brother, no sisters. One maternal aunts, out of 4 sisters, was diagnosed with breast cancer in her 16s. There  is no history of ovarian cancer in the family  GYNECOLOGIC HISTORY:  No LMP recorded. Patient has had a hysterectomy. Menarche age 40, first live birth age 59 which the patient understands increases the risk of breast cancer developing. She is GX P1. She underwent hysterectomy with bilateral salpingo-oophorectomy in the mid 90s and took hormone replacement for approximately 15  years.  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 Her husband of 78 years, [name], is a retired Engineer, structural. Their son Shari Gonzalez" was (visibly suffers her in [place]. The patient has one grandchild. She attends the [employer] locally    ADVANCED DIRECTIVES: In process   HEALTH MAINTENANCE: History  Substance Use Topics  . Smoking status: Never Smoker   . Smokeless tobacco: Never Used  . Alcohol Use: 1.2 oz/week    2 Glasses of wine per week     Comment: Occasional     Colonoscopy:  PAP:  Bone density:  Lipid panel:  Allergies  Allergen Reactions  . Dilaudid [Hydromorphone Hcl] Rash  . Guaifenesin Er Rash    Current Outpatient Prescriptions  Medication Sig Dispense Refill  . fluticasone (FLONASE) 50 MCG/ACT nasal spray Place 2 sprays into the nose as needed.      . loratadine (CLARITIN) 10 MG tablet Take 10 mg by mouth as needed.       . Multiple Vitamins-Minerals (MULTIVITAMIN WITH MINERALS) tablet Take 1 tablet by mouth daily.         No current facility-administered medications for this visit.    OBJECTIVE: Filed Vitals:   05/29/14 1615  BP: 113/61  Pulse: 66  Temp: 98.4 F (36.9 C)  Resp: 18     Body mass index is 20.72 kg/(m^2).    ECOG FS:0 - Asymptomatic  Ocular: Sclerae unicteric, pupils equal, round and reactive to light Ear-nose-throat: Oropharynx clear, dentition fair Lymphatic: No cervical or supraclavicular adenopathy Lungs no rales or rhonchi, good excursion bilaterally Heart regular rate and rhythm, no murmur appreciated Abd soft, nontender, positive bowel sounds MSK no focal spinal tenderness, no joint edema Neuro: non-focal, well-oriented, appropriate affect Breasts: The right breast is status post implant placement. There are no suspicious findings. The left breast is status post mastectomy with implant reconstruction. There is no evidence of local recurrence. The left axilla is  benign.   LAB RESULTS:  CMP     Component Value Date/Time   NA 142 05/29/2014 1557   NA 140 04/08/2013 1626   K 3.5 05/29/2014 1557   K 3.9 04/08/2013 1626   CL 104 04/08/2013 1626   CO2 28 05/29/2014 1557   CO2 27 04/08/2013 1626   GLUCOSE 130 05/29/2014 1557   GLUCOSE 78 04/08/2013 1626   BUN 17.3 05/29/2014 1557   BUN 19 04/08/2013 1626   CREATININE 0.8 05/29/2014 1557   CREATININE 0.6 04/08/2013 1626   CALCIUM 9.3 05/29/2014 1557   CALCIUM 9.4 04/08/2013 1626   PROT 6.7 05/29/2014 1557   PROT 7.1 04/08/2013 1626   ALBUMIN 4.2 05/29/2014 1557   ALBUMIN 4.3 04/08/2013 1626   AST 18 05/29/2014 1557   AST 17 04/08/2013 1626   ALT 17 05/29/2014 1557   ALT 16 04/08/2013 1626   ALKPHOS 75 05/29/2014 1557   ALKPHOS 67 04/08/2013 1626   BILITOT 0.56 05/29/2014 1557   BILITOT 0.6 04/08/2013 1626   GFRNONAA >60 04/06/2011 1243   GFRAA >60 04/06/2011 1243    I No  results found for this basename: SPEP,  UPEP,   kappa and lambda light chains    Lab Results  Component Value Date   WBC 4.4 05/29/2014   NEUTROABS 2.8 05/29/2014   HGB 13.2 05/29/2014   HCT 39.7 05/29/2014   MCV 90.8 05/29/2014   PLT 159 05/29/2014      Chemistry      Component Value Date/Time   NA 142 05/29/2014 1557   NA 140 04/08/2013 1626   K 3.5 05/29/2014 1557   K 3.9 04/08/2013 1626   CL 104 04/08/2013 1626   CO2 28 05/29/2014 1557   CO2 27 04/08/2013 1626   BUN 17.3 05/29/2014 1557   BUN 19 04/08/2013 1626   CREATININE 0.8 05/29/2014 1557   CREATININE 0.6 04/08/2013 1626      Component Value Date/Time   CALCIUM 9.3 05/29/2014 1557   CALCIUM 9.4 04/08/2013 1626   ALKPHOS 75 05/29/2014 1557   ALKPHOS 67 04/08/2013 1626   AST 18 05/29/2014 1557   AST 17 04/08/2013 1626   ALT 17 05/29/2014 1557   ALT 16 04/08/2013 1626   BILITOT 0.56 05/29/2014 1557   BILITOT 0.6 04/08/2013 1626       Lab Results  Component Value Date   LABCA2 17 03/21/2011    No components found with this basename: LABCA125    No results found for this basename: INR,  in the last 168  hours  Urinalysis    Component Value Date/Time   COLORURINE YELLOW 04/06/2011 [place] 04/06/2011 1243   LABSPEC 1.009 04/06/2011 1243   PHURINE 6.5 04/06/2011 [place] 04/06/2011 [place] 04/06/2011 [place] 04/06/2011 [place] 04/06/2011 1243   PROTEINUR NEGATIVE 04/06/2011 1243   UROBILINOGEN 0.2 04/06/2011 1243   NITRITE NEGATIVE 04/06/2011 1243   LEUKOCYTESUR NEGATIVE 04/06/2011 1243    STUDIES: CLINICAL DATA: Screening.  EXAM:  DIGITAL SCREENING UNILATERAL RIGHT MAMMOGRAM WITH CAD  The patient has retropectoral implant. Standard and implant  displaced views were performed.  COMPARISON: Previous exam(s)  ACR Breast Density Category c: The breast tissue is heterogeneously  dense, which may obscure small masses.  FINDINGS:  The patient has had a left mastectomy. There are no findings  suspicious for malignancy. Images were processed with CAD.  IMPRESSION:  No mammographic evidence of malignancy. A result letter of this  screening mammogram will be mailed directly to the patient.  RECOMMENDATION:  Screening mammogram in one year. (Code:SM-B-01Y)  BI-RADS CATEGORY 1: Negative.  Electronically Signed  By: Shon Hale M.D.  On: 03/19/2014 15:34   ASSESSMENT: 63 y.o. 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  PLAN: I spent approximately an hour with [place] going over her  history, diagnosis, and prognosis. She understands that noninvasive breast cancer in itself is not life-threatening. All the cancer cells are trapped in the milk ducts in the breast. They cannot travel 12 vital organ. They cannot even travel to the armpit. That is why she did not have  sentinel lymph nodes checked in the initial surgery. (She did have sentinel lymph nodes checked when she had her mastectomy in case invasive disease might have been found at the time, since after mastectomy sentinel lymph node sampling would not be possible and in that case if they had found invasive disease they would've had to do a complete axillary dissection).  In addition, since the cancer cells are trapped in the ducts, when all the ducts are removed by mastectomy the cancerous cured. This means she was done with this cancer at the time of her mastectomy.  It makes sense to consider tamoxifen as a preventive. It would cut that in half the risk of her developing another breast cancer in the remaining breast. That risk is approximately 1/2%.. We did discuss the possible toxicities, side effects and complications of tamoxifen briefly and she is not interested in taking it on a preventative basis. Again she understands this is not a live for death issue. Tamoxifen might reduce her risk of developing another breast cancer by 5-10%, but if she does develop 1 we will find it and presumably cured.  I am going to see her on July of 2016 2017, at which time we will discontinue followup. In the meantime she may want to use some other resources available here, including the "intimacy and pelvic health" program which is just starting,, perhaps participating in some of the mass size or you have a groups, or coming to the "finding your new normal" group.  The patient has a good understanding of the overall plan. She agrees with it. She knows the goal of treatment in her case is cure. She will call with any problems that may develop  before her next visit here.  Chauncey Cruel, MD   05/29/2014 6:04 PM

## 2014-05-30 ENCOUNTER — Telehealth: Payer: Self-pay | Admitting: Oncology

## 2014-05-30 NOTE — Telephone Encounter (Signed)
cld & left messager for pt in re to appt time & date-*mailed copy

## 2015-02-12 ENCOUNTER — Other Ambulatory Visit: Payer: Self-pay

## 2015-02-12 DIAGNOSIS — Z1231 Encounter for screening mammogram for malignant neoplasm of breast: Secondary | ICD-10-CM

## 2015-03-20 ENCOUNTER — Ambulatory Visit
Admission: RE | Admit: 2015-03-20 | Discharge: 2015-03-20 | Disposition: A | Discharge status: Home / Self Care | Payer: 59 | Source: Ambulatory Visit

## 2015-03-20 ENCOUNTER — Other Ambulatory Visit: Payer: Self-pay

## 2015-03-20 DIAGNOSIS — Z1231 Encounter for screening mammogram for malignant neoplasm of breast: Secondary | ICD-10-CM

## 2015-03-23 ENCOUNTER — Other Ambulatory Visit: Payer: Self-pay | Admitting: Obstetrics & Gynecology

## 2015-03-24 LAB — CYTOLOGY - PAP

## 2015-04-06 ENCOUNTER — Ambulatory Visit (HOSPITAL_BASED_OUTPATIENT_CLINIC_OR_DEPARTMENT_OTHER): Payer: 59 | Admitting: Oncology

## 2015-04-06 VITALS — BP 136/72 | HR 71 | Temp 98.4°F | Resp 18 | Ht 65.0 in | Wt 128.1 lb

## 2015-04-06 DIAGNOSIS — C50912 Malignant neoplasm of unspecified site of left female breast: Secondary | ICD-10-CM

## 2015-04-06 DIAGNOSIS — Z853 Personal history of malignant neoplasm of breast: Secondary | ICD-10-CM

## 2015-04-06 NOTE — Progress Notes (Signed)
[place]  Telephone:(336) [phone] Fax:(336) [phone]     ID: Shari Gonzalez DOB: 1950/10/10  MR#: [id number]  XIP#:[id number]  PCP: Loura Pardon, MD GYN: Evette Cristal SU: Fanny Skates OTHER MD: Crissie Reese   CHIEF COMPLAINT: Ductal carcinoma in situ  CURRENT TREATMENT: Observation   BREAST CANCER HISTORY: From Dr. Cena Benton Rubin's original intake note:  "Ms. Shari Gonzalez has been in good health.  She undergoes annual screening mammography.  She underwent a screening mammogram 02/16/2011, which suggested an area of microcalcification.  Diagnostic left mammogram 03/03/2011 showed a 1.5 cm area of scattered microcalcifications upper outer left breast.  Biopsy was recommended.  Biopsy was on 03/07/2011.  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  The patient has undergone MRI scan of both breasts on 03/16/2011 that showed an area measuring 0.7 x 2 x 1.5 cm area of linear enhancement to the left breast.  No other abnormalities were seen.  The patient is here for discussion prior to seeing Dr. Dalbert Batman again.  She is here for followup after lumpectomy.  The lumpectomy took place on 03/14/2011 and showed high-grade DCIS with calcifications, comedo necrosis and __________ superior, anterior margins __________ margin, 0.4 cm from medial margin.  Re-excision of these areas showed some close margins.  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  The final pathology did show multiple foci of in situ carcinoma, the largest measuring greater than 0.8 cm."  Her subsequent history as detailed below  INTERVAL HISTORY: Shari Gonzalez returns today for followup of her noninvasive breast cancer. The interval history is generally unremarkable. They have a second grandchild. The [place] father did die at the age of 75 earlier this year..  ROS: She had a pain like a burning in  the medial left suprapatellar area. She is not aware of having done any unusual exercise or other reason for this. It is getting better however. She still has night sweats and hot flashes. She bruises easily. 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 A detailed review of systems today was otherwise noncontributory  PAST MEDICAL HISTORY: Past Medical History  Diagnosis Date  . Allergy     seasonal  . Breast cancer     PAST SURGICAL HISTORY: Past Surgical History  Procedure Laterality Date  . Abdominal hysterectomy      complete with BSO  . Mastectomy  July 2012    bilateral mastectomy  . Breast reconstruction with placement of tissue expander and flex hd (acellular hydrated dermis)      FAMILY HISTORY Family History  Problem Relation Age of Onset  . Alzheimer's disease Mother   . Kidney cancer Father   . Lung cancer Father   . Gout Father   . Diabetes Paternal Grandmother   . Breast cancer Maternal Aunt    the patient's father is alive at age 34. He has a history of renal cell carcinoma age 63 and lung cancer 82. He was a smoker. The patient's mother died at age 73 with Alzheimer's disease. The patient one brother, no sisters. One maternal aunts, out of 4 sisters, was diagnosed with breast cancer in her 46s. There is no history of ovarian cancer in the family  GYNECOLOGIC HISTORY:  No LMP recorded. Patient has had a hysterectomy. Menarche age 42, first live birth age 57 which the patient understands increases the risk of breast cancer developing. She is  GX P1. She underwent hysterectomy with bilateral salpingo-oophorectomy in the mid 90s and took hormone replacement for approximately 15 years.  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 Her husband of 31 years, Shari Gonzalez, is a retired Engineer, structural. Their son Shari Gonzalez")  is a Engineer, structural in [place]. The patient has 2 grandchildren She attends the [employer] locally    HEALTH MAINTENANCE: History  Substance Use Topics  . Smoking status: Never Smoker   . Smokeless tobacco: Never Used  . Alcohol Use: 1.2 oz/week    2 Glasses of wine per week     Comment: Occasional     Colonoscopy: due 2016  PAP: June 2016  Bone density:  Lipid panel:  Allergies  Allergen Reactions  . Dilaudid [Hydromorphone Hcl] Rash  . Guaifenesin Er Rash    Current Outpatient Prescriptions  Medication Sig Dispense Refill  . fluticasone (FLONASE) 50 MCG/ACT nasal spray Place 2 sprays into the nose as needed.    . loratadine (CLARITIN) 10 MG tablet Take 10 mg by mouth as needed.     . Multiple Vitamins-Minerals (MULTIVITAMIN WITH MINERALS) tablet Take 1 tablet by mouth daily.       No current facility-administered medications for this visit.    OBJECTIVE: Middle-aged white woman in no acute distress Filed Vitals:   04/06/15 1414  BP: 136/72  Pulse: 71  Temp: 98.4 F (36.9 C)  Resp: 18     Body mass index is 21.32 kg/(m^2).    ECOG FS:0 - Asymptomatic  Sclerae unicteric, pupils round and equal Oropharynx clear and moist-- no thrush or other lesions No cervical or supraclavicular adenopathy Lungs no rales or rhonchi Heart regular rate and rhythm Abd soft, nontender, positive bowel sounds MSK no focal spinal tenderness, no upper extremity lymphedema Neuro: nonfocal, well oriented, appropriate affect Breasts: The right breast is status post implant placement. There is no suspicious finding. The left breast is status post mastectomy with implant reconstruction. There is no evidence of local recurrence. The left axilla is benign.  LAB RESULTS:  CMP     Component Value Date/Time   NA 142 05/29/2014 1557   NA 140 04/08/2013 1626   K 3.5 05/29/2014 1557   K 3.9 04/08/2013 1626   CL 104 04/08/2013 1626   CO2 28 05/29/2014 1557   CO2 27 04/08/2013 1626   GLUCOSE 130 05/29/2014 1557   GLUCOSE 78 04/08/2013 1626   BUN 17.3 05/29/2014 1557    BUN 19 04/08/2013 1626   CREATININE 0.8 05/29/2014 1557   CREATININE 0.6 04/08/2013 1626   CALCIUM 9.3 05/29/2014 1557   CALCIUM 9.4 04/08/2013 1626   PROT 6.7 05/29/2014 1557   PROT 7.1 04/08/2013 1626   ALBUMIN 4.2 05/29/2014 1557   ALBUMIN 4.3 04/08/2013 1626   AST 18 05/29/2014 1557   AST 17 04/08/2013 1626   ALT 17 05/29/2014 1557   ALT 16 04/08/2013 1626   ALKPHOS 75 05/29/2014 1557   ALKPHOS 67 04/08/2013 1626   BILITOT 0.56 05/29/2014 1557   BILITOT 0.6 04/08/2013 1626   GFRNONAA >60 04/06/2011 1243   GFRAA >60 04/06/2011 1243    I No results found for: SPEP  Lab Results  Component Value Date   WBC 4.4 05/29/2014   NEUTROABS 2.8 05/29/2014   HGB 13.2 05/29/2014   HCT 39.7 05/29/2014   MCV 90.8 05/29/2014   PLT 159 05/29/2014      Chemistry  Component Value Date/Time   NA 142 05/29/2014 1557   NA 140 04/08/2013 1626   K 3.5 05/29/2014 1557   K 3.9 04/08/2013 1626   CL 104 04/08/2013 1626   CO2 28 05/29/2014 1557   CO2 27 04/08/2013 1626   BUN 17.3 05/29/2014 1557   BUN 19 04/08/2013 1626   CREATININE 0.8 05/29/2014 1557   CREATININE 0.6 04/08/2013 1626      Component Value Date/Time   CALCIUM 9.3 05/29/2014 1557   CALCIUM 9.4 04/08/2013 1626   ALKPHOS 75 05/29/2014 1557   ALKPHOS 67 04/08/2013 1626   AST 18 05/29/2014 1557   AST 17 04/08/2013 1626   ALT 17 05/29/2014 1557   ALT 16 04/08/2013 1626   BILITOT 0.56 05/29/2014 1557   BILITOT 0.6 04/08/2013 1626       Lab Results  Component Value Date   LABCA2 17 03/21/2011    No components found for: [id number]  No results for input(s): INR in the last 168 hours.  Urinalysis    Component Value Date/Time   COLORURINE YELLOW 04/06/2011 1243   APPEARANCEUR CLEAR 04/06/2011 1243   LABSPEC 1.009 04/06/2011 1243   PHURINE 6.5 04/06/2011 1243   GLUCOSEU NEGATIVE 04/06/2011 1243   HGBUR NEGATIVE 04/06/2011 1243   BILIRUBINUR NEGATIVE 04/06/2011 1243   KETONESUR NEGATIVE 04/06/2011 1243    PROTEINUR NEGATIVE 04/06/2011 1243   UROBILINOGEN 0.2 04/06/2011 1243   NITRITE NEGATIVE 04/06/2011 1243   LEUKOCYTESUR NEGATIVE 04/06/2011 1243    STUDIES:  CLINICAL DATA: Screening.  EXAM: DIGITAL SCREENING UNILATERAL RIGHT MAMMOGRAM WITH IMPLANT, TOMO AND CAD  The patient has retropectoral implant. Standard and implant displaced views were performed.  COMPARISON: Previous exam(s).  ACR Breast Density Category c: The breast tissue is heterogeneously dense, which may obscure small masses.  FINDINGS: There are no findings suspicious for malignancy. Images were processed with CAD.  IMPRESSION: No mammographic evidence of malignancy. A result letter of this screening mammogram will be mailed directly to the patient.  RECOMMENDATION: Screening mammogram in one year. (Code:SM-B-01Y)  BI-RADS CATEGORY 1: Negative.   Electronically Signed  By: Skipper Cliche M.D.  On: 03/20/2015 16:07    ASSESSMENT: 64 y.o. 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  PLAN: Shari Gonzalez is doing fine from a breast cancer point of view and we again discussed the fact that her breast cancer was noninvasive and therefore not life threatening. Furthermore the cure rate for ductal carcinoma in situ with mastectomy approaches 100%  Accordingly I am comfortable releasing her back to her primary care physician's. All she needs is yearly right mammogram and  yearly physician breast exam.  I will be glad to see Shari Gonzalez had any point in the future as the need arises, but as of now we are making no further routine appointments for her here.   Chauncey Cruel, MD   04/06/2015 2:46 PM

## 2015-06-11 ENCOUNTER — Encounter: Payer: Self-pay | Admitting: Internal Medicine

## 2015-09-29 ENCOUNTER — Ambulatory Visit (AMBULATORY_SURGERY_CENTER): Payer: Self-pay | Admitting: *Deleted

## 2015-09-29 VITALS — Ht 65.0 in | Wt 131.0 lb

## 2015-09-29 DIAGNOSIS — Z1211 Encounter for screening for malignant neoplasm of colon: Secondary | ICD-10-CM

## 2015-09-29 NOTE — Progress Notes (Signed)
No egg or soy allergy known to patient - she avoids soy  No issues with past sedation with any surgeries  or procedures, no intubation problems --but hx nausea post op No diet pills per patient  No home 02 use per patient  Pt was seen 2012 with diarrhea for months and it cleared up after several months but has restarted since end of summer 2016 and she restarted probiotic and has reaaly watched diet but still having diarrhea. She believes is diet related. Had multiple tests in 2012 all negative.

## 2015-10-02 ENCOUNTER — Telehealth: Payer: Self-pay | Admitting: Internal Medicine

## 2015-10-02 NOTE — Telephone Encounter (Signed)
Instructions mailed.

## 2015-10-05 ENCOUNTER — Encounter: Payer: 59 | Admitting: Internal Medicine

## 2015-11-10 ENCOUNTER — Ambulatory Visit (INDEPENDENT_AMBULATORY_CARE_PROVIDER_SITE_OTHER)
Admission: RE | Admit: 2015-11-10 | Discharge: 2015-11-10 | Disposition: A | Discharge status: Home / Self Care | Payer: Commercial Managed Care - HMO | Source: Ambulatory Visit | Attending: Family Medicine | Admitting: Family Medicine

## 2015-11-10 ENCOUNTER — Encounter: Payer: Self-pay | Admitting: Family Medicine

## 2015-11-10 ENCOUNTER — Ambulatory Visit (INDEPENDENT_AMBULATORY_CARE_PROVIDER_SITE_OTHER): Payer: Commercial Managed Care - HMO | Admitting: Family Medicine

## 2015-11-10 ENCOUNTER — Other Ambulatory Visit (INDEPENDENT_AMBULATORY_CARE_PROVIDER_SITE_OTHER): Payer: Commercial Managed Care - HMO

## 2015-11-10 VITALS — BP 108/62 | HR 76 | Ht 65.0 in | Wt 130.0 lb

## 2015-11-10 DIAGNOSIS — M25562 Pain in left knee: Secondary | ICD-10-CM | POA: Diagnosis not present

## 2015-11-10 DIAGNOSIS — M23007 Cystic meniscus, unspecified meniscus, left knee: Secondary | ICD-10-CM

## 2015-11-10 MED ORDER — DICLOFENAC SODIUM 2 % TD SOLN: TRANSDERMAL | Status: DC

## 2015-11-10 NOTE — Assessment & Plan Note (Signed)
Patient does have a cyst noted. Think likely patient did have a tear but it seems to be healed at this time. Patient did not have any locking at this time and had a negative McMurray's today. I'm expecting patient did do well with conservative therapy. Patient given a prescription for topical anti-inflammatories, icing, patient will do home exercises and work with Product/process development scientist. Patient will try to make these changes and come back and see me again in 3 weeks. Continued have trouble we will consider injection as well as formal physical therapy. X-rays pending secondary to history of breast cancer.

## 2015-11-10 NOTE — Patient Instructions (Addendum)
Good to see you  Lets get an xray today as well.  Ice 20 minutes 2 times daily. Usually after activity and before bed. Exercises 3 times a week.  pennsaid pinkie amount topically 2 times daily as needed.  Vitamin D 2000 IU daily Iron 65 mg daily with 500mg  of vitamin C Avoid running or any twisting motion.  OK to bike and even do elliptical.  Machines are ok but no free weights til I see you again.  See me again in 3 weeks.

## 2015-11-10 NOTE — Progress Notes (Signed)
Pre visit review using our clinic review tool, if applicable. No additional management support is needed unless otherwise documented below in the visit note. 

## 2015-11-10 NOTE — Progress Notes (Signed)
[facility] [place], [place] [address] Phone: [phone] Subjective:    I'm seeing this patient by the request  of:  Loura Pardon, MD   CC: Left knee pain  [id number] Shari Gonzalez is a 65 y.o. female coming in with complaint of left knee pain. Discussed the pain is more of a dull, throbbing aching sensation. Patient started to try to work out a lot regular basis but on the treadmill started having pain on the medial aspect the leg. It was even having pain when she lay down at night. Had a catching motion. States that sometimes it seems to give her some locking. This seems to be much more intermittent now. Over the course last 2 weeks it has seemed to be doing improvement but he she is also been doing less activity. Once this remain active. Rates the severity of discomfort as 5 out of 10. States that she do daily activities fairly regularly.     Past Medical History  Diagnosis Date  . Allergy     seasonal  . Breast cancer ([place])     left   . Heart murmur   . Diarrhea    Past Surgical History  Procedure Laterality Date  . Abdominal hysterectomy      complete with BSO  . Mastectomy  July 2012    bilateral mastectomy  . Breast reconstruction with placement of tissue expander and flex hd (acellular hydrated dermis)    . Colonoscopy  2006   Social History   Social History  . Marital Status: Married    Spouse Name: N/A  . Number of Children: 2  . Years of Education: N/A   Occupational History  .     Social History Main Topics  . Smoking status: Never Smoker   . Smokeless tobacco: Never Used  . Alcohol Use: 1.2 oz/week    2 Glasses of wine per week     Comment: Occasional  . Drug Use: No  . Sexual Activity: Yes    Birth Control/ Protection: None   Other Topics Concern  . None   Social History Narrative   Allergies  Allergen Reactions  . Dilaudid [Hydromorphone Hcl] Rash  . Guaifenesin Er Rash   Family History   Problem Relation Age of Onset  . Alzheimer's disease Mother   . Kidney cancer Father   . Lung cancer Father   . Gout Father   . Diabetes Paternal Grandmother   . Breast cancer Maternal Aunt   . Colon cancer Neg Hx   . Colon polyps Neg Hx   . Esophageal cancer Neg Hx   . Rectal cancer Neg Hx   . Stomach cancer Neg Hx   . Liver cancer Paternal Grandfather     Past medical history, social, surgical and family history all reviewed in electronic medical record.  No pertanent information unless stated regarding to the chief complaint.   Review of Systems: No headache, visual changes, nausea, vomiting, diarrhea, constipation, dizziness, abdominal pain, skin rash, fevers, chills, night sweats, weight loss, swollen lymph nodes, body aches, joint swelling, muscle aches, chest pain, shortness of breath, mood changes.   Objective Blood pressure 108/62, pulse 76, height 5\' 5"  (1.651 m), weight 130 lb (58.968 kg), SpO2 99 %.  General: No apparent distress alert and oriented x3 mood and affect normal, dressed appropriately.  HEENT: Pupils equal, extraocular movements intact  Respiratory: Patient's speak in full sentences and does not appear short of  breath  Cardiovascular: No lower extremity edema, non tender, no erythema  Skin: Warm dry intact with no signs of infection or rash on extremities or on axial skeleton.  Abdomen: Soft nontender  Neuro: Cranial nerves II through XII are intact, neurovascularly intact in all extremities with 2+ DTRs and 2+ pulses.  Lymph: No lymphadenopathy of posterior or anterior cervical chain or axillae bilaterally.  Gait normal with good balance and coordination.  MSK:  Non tender with full range of motion and good stability and symmetric strength and tone of shoulders, elbows, wrist, hip, knee and ankles bilaterally.  Knee: Left Normal to inspection with no erythema or effusion or obvious bony abnormalities. Mild medial joint line tenderness is also some  tenderness over the hamstring tendon. ROM full in flexion and extension and lower leg rotation. Ligaments with solid consistent endpoints including ACL, PCL, LCL, MCL. Negative Mcmurray's, Apley's, and Thessalonian tests. Mild painful patellar compression. Patellar glide with minimal crepitus. Patellar and quadriceps tendons unremarkable. Hamstring and quadriceps strength is normal.    MSK US performed of: Left This study was ordered, performed, and interpreted by Charlann Boxer D.O.  Knee: All structures visualized. Patient does have what appears to be a cyst over the medial aspect of the knee over the meniscus. Seems to be. Meniscal. No true tear or any displacement noted. Mild distal hamstring tendinopathy noted. Patellar Tendon unremarkable on long and transverse views without effusion. No abnormality of prepatellar bursa. LCL and MCL unremarkable on long and transverse views. No abnormality of origin of medial or lateral head of the gastrocnemius.  IMPRESSION:  Perimeniscal cyst with mild hamstring tendinopathy.   Procedure note [id number]; 15 minutes spent for Therapeutic exercises as stated in above notes.  This included exercises focusing on stretching, strengthening, with significant focus on eccentric aspects.  Flexion and extension exercises working on hamstring stretching and vastus medialis obliques strengthening. Hip abductor strengthening for stability as well. Proper technique shown and discussed handout in great detail with ATC.  All questions were discussed and answered.      Impression and Recommendations:     This case required medical decision making of moderate complexity.      Note: This dictation was prepared with Dragon dictation along with smaller phrase technology. Any transcriptional errors that result from this process are unintentional.

## 2015-11-13 ENCOUNTER — Ambulatory Visit (AMBULATORY_SURGERY_CENTER): Payer: Commercial Managed Care - HMO | Admitting: Internal Medicine

## 2015-11-13 ENCOUNTER — Encounter: Payer: Self-pay | Admitting: Internal Medicine

## 2015-11-13 VITALS — BP 123/57 | HR 52 | Temp 98.4°F | Resp 16 | Ht 65.0 in | Wt 131.0 lb

## 2015-11-13 DIAGNOSIS — Z1211 Encounter for screening for malignant neoplasm of colon: Secondary | ICD-10-CM

## 2015-11-13 MED ORDER — ONDANSETRON HCL 4 MG PO TABS: 4.0000 mg | ORAL_TABLET | Freq: Three times a day (TID) | ORAL | Status: DC | PRN

## 2015-11-13 MED ORDER — SODIUM CHLORIDE 0.9 % IV SOLN: 500.0000 mL | INTRAVENOUS | Status: DC

## 2015-11-13 NOTE — Op Note (Signed)
[place]  [address] [place], [address]   COLONOSCOPY PROCEDURE REPORT  PATIENT: Shari Gonzalez, Shari Gonzalez  MR#: [id number] BIRTHDATE: 01-11-1951 , 64  yrs. old GENDER: female ENDOSCOPIST: Gatha Mayer, MD, [facility] PROCEDURE DATE:  11/13/2015 PROCEDURE:   Colonoscopy, screening First Screening Colonoscopy - Avg.  risk and is 50 yrs.  old or older - No.  Prior Negative Screening - Now for repeat screening. 10 or more years since last screening  History of Adenoma - Now for follow-up colonoscopy & has been > or = to 3 yrs.  N/A  Polyps removed today? No Recommend repeat exam, <10 yrs? No ASA CLASS:   Class II INDICATIONS:Screening for colonic neoplasia and Colorectal Neoplasm Risk Assessment for this procedure is average risk. MEDICATIONS: Propofol 200 mg IV and Monitored anesthesia care  DESCRIPTION OF PROCEDURE:   After the risks benefits and alternatives of the procedure were thoroughly explained, informed consent was obtained.  The digital rectal exam revealed no abnormalities of the rectum.   The LB [id number]  endoscope was introduced through the anus and advanced to the cecum, which was identified by both the appendix and ileocecal valve. No adverse events experienced.   The quality of the prep was excellent. (MiraLax was used)  The instrument was then slowly withdrawn as the colon was fully examined. Estimated blood loss is zero unless otherwise noted in this procedure report.      COLON FINDINGS: A normal appearing cecum, ileocecal valve, and appendiceal orifice were identified.  The ascending, transverse, descending, sigmoid colon, and rectum appeared unremarkable. Retroflexed views revealed no abnormalities. The time to cecum = 5.4 Withdrawal time = 9.8   The scope was withdrawn and the procedure completed. COMPLICATIONS: There were no immediate complications.  ENDOSCOPIC IMPRESSION: Normal colonoscopy - excellent prep - second  screening  RECOMMENDATIONS: 1.  Repeat colonoscopy 10 years. 2.  Try ondansetron 4 mg prn IBS diarrhea  eSigned:  Gatha Mayer, MD, [facility] 11/13/2015 3:12 PM   cc: The Patient

## 2015-11-13 NOTE — Patient Instructions (Addendum)
The colonoscopy was normal. Next routine colonoscopy in 10 years - 2027  I have sent a prescription for ondansetron to your pharmacy - see if that relieves the diarrhea attacks that occur with your IBS.  I appreciate the opportunity to care for you. Gatha Mayer, MD, FACG  YOU HAD AN ENDOSCOPIC PROCEDURE TODAY AT [facility]:   Refer to the procedure report that was given to you for any specific questions about what was found during the examination.  If the procedure report does not answer your questions, please call your gastroenterologist to clarify.  If you requested that your care partner not be given the details of your procedure findings, then the procedure report has been included in a sealed envelope for you to review at your convenience later.  YOU SHOULD EXPECT: Some feelings of bloating in the abdomen. Passage of more gas than usual.  Walking can help get rid of the air that was put into your GI tract during the procedure and reduce the bloating. If you had a lower endoscopy (such as a colonoscopy or flexible sigmoidoscopy) you may notice spotting of blood in your stool or on the toilet paper. If you underwent a bowel prep for your procedure, you may not have a normal bowel movement for a few days.  Please Note:  You might notice some irritation and congestion in your nose or some drainage.  This is from the oxygen used during your procedure.  There is no need for concern and it should clear up in a day or so.  SYMPTOMS TO REPORT IMMEDIATELY:   Following lower endoscopy (colonoscopy or flexible sigmoidoscopy):  Excessive amounts of blood in the stool  Significant tenderness or worsening of abdominal pains  Swelling of the abdomen that is new, acute  Fever of 100F or higher   For urgent or emergent issues, a gastroenterologist can be reached at any hour by calling [phone].   DIET: Your first meal following the procedure should be a small meal  and then it is ok to progress to your normal diet. Heavy or fried foods are harder to digest and may make you feel nauseous or bloated.  Likewise, meals heavy in dairy and vegetables can increase bloating.  Drink plenty of fluids but you should avoid alcoholic beverages for 24 hours.  ACTIVITY:  You should plan to take it easy for the rest of today and you should NOT DRIVE or use heavy machinery until tomorrow (because of the sedation medicines used during the test).    FOLLOW UP: Our staff will call the number listed on your records the next business day following your procedure to check on you and address any questions or concerns that you may have regarding the information given to you following your procedure. If we do not reach you, we will leave a message.  However, if you are feeling well and you are not experiencing any problems, there is no need to return our call.  We will assume that you have returned to your regular daily activities without incident.  If any biopsies were taken you will be contacted by phone or by letter within the next 1-3 weeks.  Please call us at [phone] if you have not heard about the biopsies in 3 weeks.    SIGNATURES/CONFIDENTIALITY: You and/or your care partner have signed paperwork which will be entered into your electronic medical record.  These signatures attest to the fact that that the information above  on your After Visit Summary has been reviewed and is understood.  Full responsibility of the confidentiality of this discharge information lies with you and/or your care-partner.  Normal  Repeat colonoscopy in 10 years 2027.

## 2015-11-13 NOTE — Progress Notes (Signed)
To recovery, report to [place], RN, VSS. 

## 2015-11-16 ENCOUNTER — Telehealth: Payer: Self-pay | Admitting: *Deleted

## 2015-11-16 NOTE — Telephone Encounter (Signed)
  Follow up Call-  Call back number 11/13/2015  Post procedure Call Back phone  # [phone]  Permission to leave phone message Yes     Patient questions:  Do you have a fever, pain , or abdominal swelling? No. Pain Score  0 *  Have you tolerated food without any problems? Yes.    Have you been able to return to your normal activities? Yes.    Do you have any questions about your discharge instructions: Diet   No. Medications  No. Follow up visit  No.  Do you have questions or concerns about your Care? No.  Actions: * If pain score is 4 or above: No action needed, pain <4.

## 2015-12-01 ENCOUNTER — Ambulatory Visit (INDEPENDENT_AMBULATORY_CARE_PROVIDER_SITE_OTHER): Payer: Commercial Managed Care - HMO | Admitting: Family Medicine

## 2015-12-01 ENCOUNTER — Other Ambulatory Visit (INDEPENDENT_AMBULATORY_CARE_PROVIDER_SITE_OTHER): Payer: Commercial Managed Care - HMO

## 2015-12-01 ENCOUNTER — Encounter: Payer: Self-pay | Admitting: Family Medicine

## 2015-12-01 VITALS — BP 110/72 | HR 63 | Ht 65.0 in | Wt 130.0 lb

## 2015-12-01 DIAGNOSIS — M65312 Trigger thumb, left thumb: Secondary | ICD-10-CM | POA: Diagnosis not present

## 2015-12-01 DIAGNOSIS — M79645 Pain in left finger(s): Secondary | ICD-10-CM

## 2015-12-01 DIAGNOSIS — M23007 Cystic meniscus, unspecified meniscus, left knee: Secondary | ICD-10-CM | POA: Diagnosis not present

## 2015-12-01 NOTE — Assessment & Plan Note (Signed)
Patient is making significant improvement at this time. Encourage patient continued to increase her activity at this time. No longer sees any significant tear. Patient does have some chronic degenerative changes as well as some mild arthritic changes. Due to that she'll do well. If recurrent cyst occurs weeks do some aspiration.

## 2015-12-01 NOTE — Progress Notes (Signed)
Pre visit review using our clinic review tool, if applicable. No additional management support is needed unless otherwise documented below in the visit note. 

## 2015-12-01 NOTE — Assessment & Plan Note (Signed)
Patient given injection today and tolerated the procedure well. We discussed icing regimen. Patient will do bracing at night for the next 2 weeks. We discussed that we can repeat this injection of necessary. We'll try topical anti-inflammatory. Will come back and see me again in 3-4 weeks to make sure completely resolved.

## 2015-12-01 NOTE — Progress Notes (Signed)
[name] [place], [place] [address] Phone: [phone] Subjective:    I'm seeing this patient by the request  of:  Loura Pardon, MD   CC: Left knee pain Follow-up  [id number] Shari Gonzalez is a 65 y.o. female coming in with complaint of left knee pain. Patient was seen previously and was diagnosed with a. Meniscal cyst of the left knee as well as some mild tendinopathy of the hamstring. Patient elected try conservative therapy including topical anti-inflammatories, icing, home exercises, compression. Patient statesShe is approximately 80% better. Feels like it is not as full. Feels that the exercises have been helpful. Patient has not been doing all her activities but she has been continuing to bike a regular basis. Patient denies any numbness or tingling. Patient denies any giving out on her.  Patient is having new problem. Patient is having more of a left thumb pain. States that it gets stuck in a flexed position. Sometimes very severe and is scared that is going to continue to get stored. Starting to affect her daily activities such as dressing. Rates the severity of pain a 7 out of 10 when the thumb gets stuck. Points to the IP joint. No swelling, does not rib number any injury. Has not tried any home modalities.     Past Medical History  Diagnosis Date  . Allergy     seasonal  . Breast cancer ([place])     left   . Heart murmur   . Diarrhea   . Cyst of left knee joint    Past Surgical History  Procedure Laterality Date  . Abdominal hysterectomy      complete with BSO  . Mastectomy  July 2012    bilateral mastectomy  . Breast reconstruction with placement of tissue expander and flex hd (acellular hydrated dermis)    . Colonoscopy  2006   Social History   Social History  . Marital Status: Married    Spouse Name: N/A  . Number of Children: 2  . Years of Education: N/A   Occupational History  .     Social History Main  Topics  . Smoking status: Never Smoker   . Smokeless tobacco: Never Used  . Alcohol Use: 1.2 oz/week    2 Glasses of wine per week     Comment: Occasional  . Drug Use: No  . Sexual Activity: Yes    Birth Control/ Protection: None   Other Topics Concern  . None   Social History Narrative   Allergies  Allergen Reactions  . Dilaudid [Hydromorphone Hcl] Rash  . Guaifenesin Er Rash   Family History  Problem Relation Age of Onset  . Alzheimer's disease Mother   . Kidney cancer Father   . Lung cancer Father   . Gout Father   . Diabetes Paternal Grandmother   . Breast cancer Maternal Aunt   . Colon cancer Neg Hx   . Colon polyps Neg Hx   . Esophageal cancer Neg Hx   . Rectal cancer Neg Hx   . Stomach cancer Neg Hx   . Liver cancer Paternal Grandfather     Past medical history, social, surgical and family history all reviewed in electronic medical record.  No pertanent information unless stated regarding to the chief complaint.   Review of Systems: No headache, visual changes, nausea, vomiting, diarrhea, constipation, dizziness, abdominal pain, skin rash, fevers, chills, night sweats, weight loss, swollen lymph nodes, body aches, joint swelling,  muscle aches, chest pain, shortness of breath, mood changes.   Objective Blood pressure 110/72, pulse 63, height 5\' 5"  (1.651 m), weight 130 lb (58.968 kg), SpO2 99 %.  General: No apparent distress alert and oriented x3 mood and affect normal, dressed appropriately.  HEENT: Pupils equal, extraocular movements intact  Respiratory: Patient's speak in full sentences and does not appear short of breath  Cardiovascular: No lower extremity edema, non tender, no erythema  Skin: Warm dry intact with no signs of infection or rash on extremities or on axial skeleton.  Abdomen: Soft nontender  Neuro: Cranial nerves II through XII are intact, neurovascularly intact in all extremities with 2+ DTRs and 2+ pulses.  Lymph: No lymphadenopathy of  posterior or anterior cervical chain or axillae bilaterally.  Gait normal with good balance and coordination.  MSK:  Non tender with full range of motion and good stability and symmetric strength and tone of shoulders, elbows, wrist, hip, knee and ankles bilaterally.  Knee: Left Normal to inspection with no erythema or effusion or obvious bony abnormalities. Nontender on exam today. ROM full in flexion and extension and lower leg rotation. Ligaments with solid consistent endpoints including ACL, PCL, LCL, MCL. Negative Mcmurray's, Apley's, and Thessalonian tests. Mild painful patellar compression. Patellar glide with minimal crepitus. Patellar and quadriceps tendons unremarkable. Hamstring and quadriceps strength is normal.  Contralateral knee unremarkable.   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 Patient does have triggering of the left thumb at the IP. Patient does have a tender nodule noted on the flexor tendon.  Procedure: Real-time Ultrasound Guided Injection of left thumb trigger nodule at the IP Device: GE Logiq E  Ultrasound guided injection is preferred based studies that show increased duration, increased effect, greater accuracy, decreased procedural pain, increased response rate, and decreased cost with ultrasound guided versus blind injection.  Verbal informed consent obtained.  Time-out conducted.  Noted no overlying erythema, induration, or other signs of local infection.  Skin prepped in a sterile fashion.  Local anesthesia: Topical Ethyl chloride.  With sterile technique and under real time ultrasound guidance:  With a 25-gauge 1 inch needle patient was injected with a total of 0.5 mL of 0.5% Marcaine and 0.5 mL of Kenalog 40 mg/dL into the tendon sheath. Completed without difficulty  Pain immediately resolved suggesting accurate placement of the medication.  Advised to  call if fevers/chills, erythema, induration, drainage, or persistent bleeding.  Images permanently stored and available for review in the ultrasound unit.  Impression: Technically successful ultrasound guided injection.        Impression and Recommendations:     This case required medical decision making of moderate complexity.      Note: This dictation was prepared with Dragon dictation along with smaller phrase technology. Any transcriptional errors that result from this process are unintentional.

## 2015-12-01 NOTE — Patient Instructions (Addendum)
Good to see you  The knee looks good  Start increasing your activity 10-20% a week.  Do not need to baby it as much with the cyst gone.  Ice is your friend For the thumb wear the brace at night for next 2 weeks.  See me again in 4-6 weeks if eiter of the problems is not gone.

## 2016-02-09 ENCOUNTER — Other Ambulatory Visit: Payer: Self-pay

## 2016-02-09 DIAGNOSIS — Z1231 Encounter for screening mammogram for malignant neoplasm of breast: Secondary | ICD-10-CM

## 2016-03-08 ENCOUNTER — Ambulatory Visit (INDEPENDENT_AMBULATORY_CARE_PROVIDER_SITE_OTHER): Payer: Commercial Managed Care - HMO | Admitting: Family Medicine

## 2016-03-08 ENCOUNTER — Encounter: Payer: Self-pay | Admitting: Family Medicine

## 2016-03-08 VITALS — BP 100/76 | HR 64 | Ht 65.0 in | Wt 122.0 lb

## 2016-03-08 DIAGNOSIS — M7652 Patellar tendinitis, left knee: Secondary | ICD-10-CM | POA: Diagnosis not present

## 2016-03-08 NOTE — Progress Notes (Signed)
Pre visit review using our clinic review tool, if applicable. No additional management support is needed unless otherwise documented below in the visit note. 

## 2016-03-08 NOTE — Assessment & Plan Note (Signed)
Patient does have fairly severe tenderness over the insertion of the patellar tendon over the tibial tuberosity. Possible bursitis noted as well on ultrasound. We discussed icing regimen, home exercises, topical anti-inflammatories. We discussed which activities to avoid. Patient work with Product/process development scientist to learn home exercises in greater detail. Patient will come back and see me again in 3 weeks. At that time we will start having her increase her activity slowly.

## 2016-03-08 NOTE — Progress Notes (Signed)
[name] [place], [place] [address] Phone: [phone] Subjective:    I'm seeing this patient by the request  of:  Loura Pardon, MD   CC: Left knee pain Follow-up  [id number] Shari Gonzalez is a 65 y.o. female coming in with complaint of left knee pain. Patient was seen 4 months ago and was diagnosed with a. Meniscal cyst of the left knee. Patient wanted to monitor and see if this would resolve on its own. At follow-up 4 weeks later she seemed to be improving. Patient states This pain seems to be different. More on the anterior aspect of the knee. Noticed some swelling in the area. Patient states that she was doing different working out and noticed it immediately. Patient states that it is tender to palpation. Patient denies any fevers, chills, any abnormal weight loss. Seems to stay localized. Somewhat worse with deep squatting or deep flexion of the knee. Sometimes a throbbing sensation at night. No association with food. Has not tried any over-the-counter medications and has not tried the topical anti-inflammatories in this area.     Past Medical History  Diagnosis Date  . Allergy     seasonal  . Breast cancer ([facility])     left   . Heart murmur   . Diarrhea   . Cyst of left knee joint    Past Surgical History  Procedure Laterality Date  . Abdominal hysterectomy      complete with BSO  . Mastectomy  July 2012    bilateral mastectomy  . Breast reconstruction with placement of tissue expander and flex hd (acellular hydrated dermis)    . Colonoscopy  2006   Social History   Social History  . Marital Status: Married    Spouse Name: N/A  . Number of Children: 2  . Years of Education: N/A   Occupational History  .     Social History Main Topics  . Smoking status: Never Smoker   . Smokeless tobacco: Never Used  . Alcohol Use: 1.2 oz/week    2 Glasses of wine per week     Comment: Occasional  . Drug Use: No  . Sexual  Activity: Yes    Birth Control/ Protection: None   Other Topics Concern  . None   Social History Narrative   Allergies  Allergen Reactions  . Dilaudid [Hydromorphone Hcl] Rash  . Guaifenesin Er Rash   Family History  Problem Relation Age of Onset  . Alzheimer's disease Mother   . Kidney cancer Father   . Lung cancer Father   . Gout Father   . Diabetes Paternal Grandmother   . Breast cancer Maternal Aunt   . Colon cancer Neg Hx   . Colon polyps Neg Hx   . Esophageal cancer Neg Hx   . Rectal cancer Neg Hx   . Stomach cancer Neg Hx   . Liver cancer Paternal Grandfather     Past medical history, social, surgical and family history all reviewed in electronic medical record.  No pertanent information unless stated regarding to the chief complaint.   Review of Systems: No headache, visual changes, nausea, vomiting, diarrhea, constipation, dizziness, abdominal pain, skin rash, fevers, chills, night sweats, weight loss, swollen lymph nodes, body aches, joint swelling, muscle aches, chest pain, shortness of breath, mood changes.   Objective Blood pressure 100/76, pulse 64, height 5\' 5"  (1.651 m), weight 122 lb (55.339 kg), SpO2 99 %.  General: No apparent distress  alert and oriented x3 mood and affect normal, dressed appropriately.  HEENT: Pupils equal, extraocular movements intact  Respiratory: Patient's speak in full sentences and does not appear short of breath  Cardiovascular: No lower extremity edema, non tender, no erythema  Skin: Warm dry intact with no signs of infection or rash on extremities or on axial skeleton.  Abdomen: Soft nontender  Neuro: Cranial nerves II through XII are intact, neurovascularly intact in all extremities with 2+ DTRs and 2+ pulses.  Lymph: No lymphadenopathy of posterior or anterior cervical chain or axillae bilaterally.  Gait normal with good balance and coordination.  MSK:  Non tender with full range of motion and good stability and symmetric  strength and tone of shoulders, elbows, wrist, hip, knee and ankles bilaterally.  Knee: Left She does have swelling over the tibial tuberosity Tender over the tibial tuberosity. ROM full in flexion and extension and lower leg rotation. Ligaments with solid consistent endpoints including ACL, PCL, LCL, MCL. Negative Mcmurray's, Apley's, and Thessalonian tests. Mild painful patellar compression. Patellar glide with minimal crepitus. Patellar and quadriceps tendons unremarkable. Hamstring and quadriceps strength is normal.  Contralateral knee unremarkable   MSK US performed of: Left This study was ordered, performed, and interpreted by Charlann Boxer D.O.  Knee: Patient does have severe patellar tendinitis distally. Increasing Doppler flow. No avulsion fracture noted. Mild bursitis noted.  IMPRESSION:  Severe patellar tendinitis   Procedure note [id number]; 15 minutes spent for Therapeutic exercises as stated in above notes.  This included exercises focusing on stretching, strengthening, with significant focus on eccentric aspects.  Flexion and extension exercises working on hamstring stretching and vastus medialis obliques strengthening. Patient also given eccentric exercises for the knee extension. Hip abductor strengthening for stability as well. Proper technique shown and discussed handout in great detail with ATC.  All questions were discussed and answered.      Impression and Recommendations:     This case required medical decision making of moderate complexity.      Note: This dictation was prepared with Dragon dictation along with smaller phrase technology. Any transcriptional errors that result from this process are unintentional.

## 2016-03-08 NOTE — Patient Instructions (Signed)
Good to see you.  Chopat or patella strap you can get at [facility] and wear with a lot of activity  Ice 20 minutes 2 times daily. Usually after activity and before bed. pennsaid pinkie amount topically 2 times daily as needed.  Exercises 3 times a week.  OK to bike or even elliptical No running, jumping, burpees, or deep squats.  See me again in 3 weeks and we will get you back at it

## 2016-03-23 ENCOUNTER — Other Ambulatory Visit: Payer: Self-pay | Admitting: General Surgery

## 2016-03-23 ENCOUNTER — Ambulatory Visit
Admission: RE | Admit: 2016-03-23 | Discharge: 2016-03-23 | Disposition: A | Discharge status: Home / Self Care | Payer: Commercial Managed Care - HMO | Source: Ambulatory Visit | Attending: General Surgery | Admitting: General Surgery

## 2016-03-23 ENCOUNTER — Ambulatory Visit
Admission: RE | Admit: 2016-03-23 | Discharge: 2016-03-23 | Disposition: A | Discharge status: Home / Self Care | Payer: Commercial Managed Care - HMO | Source: Ambulatory Visit

## 2016-03-23 DIAGNOSIS — C50912 Malignant neoplasm of unspecified site of left female breast: Secondary | ICD-10-CM

## 2016-03-23 DIAGNOSIS — Z1231 Encounter for screening mammogram for malignant neoplasm of breast: Secondary | ICD-10-CM

## 2016-03-31 ENCOUNTER — Ambulatory Visit (INDEPENDENT_AMBULATORY_CARE_PROVIDER_SITE_OTHER): Payer: Commercial Managed Care - HMO | Admitting: Family Medicine

## 2016-03-31 ENCOUNTER — Encounter: Payer: Self-pay | Admitting: Family Medicine

## 2016-03-31 VITALS — BP 106/78 | HR 74 | Wt 122.0 lb

## 2016-03-31 DIAGNOSIS — M7652 Patellar tendinitis, left knee: Secondary | ICD-10-CM | POA: Diagnosis not present

## 2016-03-31 NOTE — Patient Instructions (Signed)
Good to see you  Ice is your friend when you need it and maybe after a lot of activity  Only wear the brace during exercise.  Try to do exercises 1-2 times a week.  If worsening pain you know where I am and we can consider a patch if needed Otherwise see me when you need me.

## 2016-03-31 NOTE — Progress Notes (Signed)
[name] [place], [place] [address] Phone: [phone] Subjective:      CC: Left knee pain Follow-up  [id number] Shari Gonzalez is a 65 y.o. female coming in with complaint of left knee pain. Patient was seen 4 months ago and was diagnosed with a. Meniscal cyst of the left knee. Then patient was seen 3 weeks ago and was diagnosed with more of a severe patella tendinitis. Patient was to wear a brace, home exercises, topical anti-inflammatories and icing. Patient states she is 100% better at this time. Not having any significant pain. Been wearing the brace religiously. Try the topical anti-inflammatories. States that it's very minimal overall and has no significant redness at the moment.     Past Medical History  Diagnosis Date  . Allergy     seasonal  . Breast cancer ([place])     left   . Heart murmur   . Diarrhea   . Cyst of left knee joint    Past Surgical History  Procedure Laterality Date  . Abdominal hysterectomy      complete with BSO  . Mastectomy  July 2012    bilateral mastectomy  . Breast reconstruction with placement of tissue expander and flex hd (acellular hydrated dermis)    . Colonoscopy  2006   Social History   Social History  . Marital Status: Married    Spouse Name: N/A  . Number of Children: 2  . Years of Education: N/A   Occupational History  .     Social History Main Topics  . Smoking status: Never Smoker   . Smokeless tobacco: Never Used  . Alcohol Use: 1.2 oz/week    2 Glasses of wine per week     Comment: Occasional  . Drug Use: No  . Sexual Activity: Yes    Birth Control/ Protection: None   Other Topics Concern  . None   Social History Narrative   Allergies  Allergen Reactions  . Dilaudid [Hydromorphone Hcl] Rash  . Guaifenesin Er Rash   Family History  Problem Relation Age of Onset  . Alzheimer's disease Mother   . Kidney cancer Father   . Lung cancer Father   . Gout  Father   . Diabetes Paternal Grandmother   . Breast cancer Maternal Aunt   . Colon cancer Neg Hx   . Colon polyps Neg Hx   . Esophageal cancer Neg Hx   . Rectal cancer Neg Hx   . Stomach cancer Neg Hx   . Liver cancer Paternal Grandfather     Past medical history, social, surgical and family history all reviewed in electronic medical record.  No pertanent information unless stated regarding to the chief complaint.   Review of Systems: No headache, visual changes, nausea, vomiting, diarrhea, constipation, dizziness, abdominal pain, skin rash, fevers, chills, night sweats, weight loss, swollen lymph nodes, body aches, joint swelling, muscle aches, chest pain, shortness of breath, mood changes.   Objective Blood pressure 106/78, pulse 74, weight 122 lb (55.339 kg).  General: No apparent distress alert and oriented x3 mood and affect normal, dressed appropriately.  HEENT: Pupils equal, extraocular movements intact  Respiratory: Patient's speak in full sentences and does not appear short of breath  Cardiovascular: No lower extremity edema, non tender, no erythema  Skin: Warm dry intact with no signs of infection or rash on extremities or on axial skeleton.  Abdomen: Soft nontender  Neuro: Cranial nerves II through XII are  intact, neurovascularly intact in all extremities with 2+ DTRs and 2+ pulses.  Lymph: No lymphadenopathy of posterior or anterior cervical chain or axillae bilaterally.  Gait normal with good balance and coordination.  MSK:  Non tender with full range of motion and good stability and symmetric strength and tone of shoulders, elbows, wrist, hip, knee and ankles bilaterally.  Knee: Left She does have swelling over the tibial tuberosity still present but less than previous nontendertoday on exam ROM full in flexion and extension and lower leg rotation. Ligaments with solid consistent endpoints including ACL, PCL, LCL, MCL. Negative Mcmurray's, Apley's, and Thessalonian  tests. Non-painful patellar compression. Patellar glide with minimal crepitus. Patellar and quadriceps tendons unremarkable. Hamstring and quadriceps strength is normal.  Contralateral knee unremarkable     Impression and Recommendations:     This case required medical decision making of moderate complexity.      Note: This dictation was prepared with Dragon dictation along with smaller phrase technology. Any transcriptional errors that result from this process are unintentional.

## 2016-03-31 NOTE — Assessment & Plan Note (Signed)
Even though the patient is not having any significant pain she still has some mild chronic changes of the tibial tuberosity. We discussed with patient that she can start increasing her activity slowly. Continue with the icing and bracing with significant activity. Patient has prescription for topical anti-inflammatories and use it as needed. Patient will follow-up as an as-needed basis or if she continues to do well and good pain-free. Worsening pain she'll come back for further evaluation.

## 2016-06-06 NOTE — Progress Notes (Signed)
[facility] [place], [place] [address] Phone: [phone] Subjective:      CC: Left knee pain Follow-up  [id number]  Shari Gonzalez is a 65 y.o. female coming in with complaint of left knee pain. Patient has had an meniscal tear with apparent meniscal cyst as well as patellar tendinitis. 2 months ago patient was 100% improved. Patient states she was doing a leg press and had an audible pop noise noted. Had some mild swelling the next day. Was concerned she was having some mild discomfort. States that it was never significantly painful. Whenever make sure she did not have any type of injury otherwise.    Past Medical History:  Diagnosis Date  . Allergy    seasonal  . Breast cancer ([place])    left   . Cyst of left knee joint   . Diarrhea   . Heart murmur    Past Surgical History:  Procedure Laterality Date  . ABDOMINAL HYSTERECTOMY     complete with BSO  . BREAST RECONSTRUCTION WITH PLACEMENT OF TISSUE EXPANDER AND FLEX HD (ACELLULAR HYDRATED DERMIS)    . COLONOSCOPY  2006  . MASTECTOMY  July 2012   bilateral mastectomy   Social History   Social History  . Marital status: Married    Spouse name: N/A  . Number of children: 2  . Years of education: N/A   Occupational History  .  [employer]. [employer]   Social History Main Topics  . Smoking status: Never Smoker  . Smokeless tobacco: Never Used  . Alcohol use 1.2 oz/week    2 Glasses of wine per week     Comment: Occasional  . Drug use: No  . Sexual activity: Yes    Birth control/ protection: None   Other Topics Concern  . Not on file   Social History Narrative  . No narrative on file   Allergies  Allergen Reactions  . Dilaudid [Hydromorphone Hcl] Rash  . Guaifenesin Er Rash   Family History  Problem Relation Age of Onset  . Alzheimer's disease Mother   . Kidney cancer Father   . Lung cancer Father   . Gout Father   . Diabetes Paternal Grandmother   .  Breast cancer Maternal Aunt   . Colon cancer Neg Hx   . Colon polyps Neg Hx   . Esophageal cancer Neg Hx   . Rectal cancer Neg Hx   . Stomach cancer Neg Hx   . Liver cancer Paternal Grandfather     Past medical history, social, surgical and family history all reviewed in electronic medical record.  No pertanent information unless stated regarding to the chief complaint.   Review of Systems: No headache, visual changes, nausea, vomiting, diarrhea, constipation, dizziness, abdominal pain, skin rash, fevers, chills, night sweats, weight loss, swollen lymph nodes, body aches, joint swelling, muscle aches, chest pain, shortness of breath, mood changes.   Objective  There were no vitals taken for this visit.  General: No apparent distress alert and oriented x3 mood and affect normal, dressed appropriately.  HEENT: Pupils equal, extraocular movements intact  Respiratory: Patient's speak in full sentences and does not appear short of breath  Cardiovascular: No lower extremity edema, non tender, no erythema  Skin: Warm dry intact with no signs of infection or rash on extremities or on axial skeleton.  Abdomen: Soft nontender  Neuro: Cranial nerves II through XII are intact, neurovascularly intact in all extremities with 2+  DTRs and 2+ pulses.  Lymph: No lymphadenopathy of posterior or anterior cervical chain or axillae bilaterally.  Gait normal with good balance and coordination.  MSK:  Non tender with full range of motion and good stability and symmetric strength and tone of shoulders, elbows, wrist, hip, knee and ankles bilaterally.  Knee: Left No swelling over the tibial tuberosity completely resolved. nontendertoday on exam ROM full in flexion and extension and lower leg rotation. Ligaments with solid consistent endpoints including ACL, PCL, LCL, MCL. Negative Mcmurray's, Apley's, and Thessalonian tests. Non painful patellar compression. Patellar glide with minimal crepitus. Patellar and  quadriceps tendons unremarkable. Hamstring and quadriceps strength is normal.  Contralateral knee unremarkable     MSK US performed of: Left This study was ordered, performed, and interpreted by Charlann Boxer D.O.  Knee: All structures visualized. Degenerative meniscal tear noted again but no cyst formation noted. Patellar Tendon unremarkable on long and transverse views without effusion. No abnormality of prepatellar bursa. LCL and MCL unremarkable on long and transverse views. No abnormality of origin of medial or lateral head of the gastrocnemius.  IMPRESSION: Continued degenerative meniscal tear.  Impression and Recommendations:     This case required medical decision making of moderate complexity.      Note: This dictation was prepared with Dragon dictation along with smaller phrase technology. Any transcriptional errors that result from this process are unintentional.

## 2016-06-07 ENCOUNTER — Encounter: Payer: Self-pay | Admitting: Family Medicine

## 2016-06-07 ENCOUNTER — Other Ambulatory Visit: Payer: Self-pay

## 2016-06-07 ENCOUNTER — Ambulatory Visit (INDEPENDENT_AMBULATORY_CARE_PROVIDER_SITE_OTHER): Payer: Commercial Managed Care - HMO | Admitting: Family Medicine

## 2016-06-07 VITALS — BP 106/70 | HR 74

## 2016-06-07 DIAGNOSIS — M25562 Pain in left knee: Secondary | ICD-10-CM | POA: Diagnosis not present

## 2016-06-07 DIAGNOSIS — M23007 Cystic meniscus, unspecified meniscus, left knee: Secondary | ICD-10-CM

## 2016-06-07 NOTE — Assessment & Plan Note (Signed)
I believe the patient's audible pop was likely secondary to her rupture. Meniscal cyst. I do not see any signs of a cyst formation at this time. Seems to be doing relatively well otherwise. Has topical anti-inflammatories if needed and we discussed increasing activity slowly. I do not see any signs of the patellar tendinitis. Patient likely will do well but will avoid deep squats. Patient will follow-up more on an as-needed basis.

## 2016-06-07 NOTE — Patient Instructions (Addendum)
Great to see you  Shari Gonzalez is your friend.  Try not to flex knee more then 75 degrees Increase vitamin D to 4000 IU daily for next 2 weeks then back down to 2000 IU daily  See me again in 3 weeks if not perfect

## 2016-08-23 ENCOUNTER — Encounter: Payer: Self-pay | Admitting: Internal Medicine

## 2016-08-23 ENCOUNTER — Ambulatory Visit (INDEPENDENT_AMBULATORY_CARE_PROVIDER_SITE_OTHER): Payer: Commercial Managed Care - HMO | Admitting: Internal Medicine

## 2016-08-23 ENCOUNTER — Telehealth: Payer: Self-pay | Admitting: Family Medicine

## 2016-08-23 ENCOUNTER — Telehealth: Payer: Self-pay

## 2016-08-23 VITALS — BP 120/76 | HR 72 | Temp 97.9°F | Resp 20 | Wt 121.2 lb

## 2016-08-23 DIAGNOSIS — J309 Allergic rhinitis, unspecified: Secondary | ICD-10-CM

## 2016-08-23 DIAGNOSIS — R05 Cough: Secondary | ICD-10-CM | POA: Diagnosis not present

## 2016-08-23 DIAGNOSIS — J069 Acute upper respiratory infection, unspecified: Secondary | ICD-10-CM | POA: Diagnosis not present

## 2016-08-23 DIAGNOSIS — R059 Cough, unspecified: Secondary | ICD-10-CM

## 2016-08-23 MED ORDER — LEVOFLOXACIN 500 MG PO TABS: 500.0000 mg | ORAL_TABLET | Freq: Every day | ORAL | 0 refills | Status: AC

## 2016-08-23 MED ORDER — TRIAMCINOLONE ACETONIDE 55 MCG/ACT NA AERO: 2.0000 | INHALATION_SPRAY | Freq: Every day | NASAL | 12 refills | Status: DC

## 2016-08-23 MED ORDER — LEVOFLOXACIN 500 MG PO TABS: 500.0000 mg | ORAL_TABLET | Freq: Every day | ORAL | 0 refills | Status: DC

## 2016-08-23 MED ORDER — HYDROCODONE-HOMATROPINE 5-1.5 MG/5ML PO SYRP: 5.0000 mL | ORAL_SOLUTION | Freq: Four times a day (QID) | ORAL | 0 refills | Status: AC | PRN

## 2016-08-23 NOTE — Telephone Encounter (Signed)
Patient Name: Shari Gonzalez DOB: 1951/02/02 Initial Comment Caller states c/o nasal congestion, chills, upper body aches, coughing. Nurse Assessment Nurse: Ronnald Ramp, RN, Miranda Date/Time (Eastern Time): 08/23/2016 10:12:33 AM Confirm and document reason for call. If symptomatic, describe symptoms. You must click the next button to save text entered. ---Caller states she has nasal congestion, cough, bodyaches, and chills for 1 week. She thinks she is unsure if she had a fever. Does the patient have any new or worsening symptoms? ---Yes Will a triage be completed? ---Yes Related visit to physician within the last 2 weeks? ---No Does the PT have any chronic conditions? (i.e. diabetes, asthma, etc.) ---No Is this a behavioral health or substance abuse call? ---No Guidelines Guideline Title Affirmed Question Affirmed Notes Common Cold [1] Fever returns after gone for over 24 hours AND [2] symptoms worse or not improved Final Disposition User See Physician within 24 Hours Ronnald Ramp, RN, Miranda Comments No appt available at primary office today. She needs to be seen today and appt scheduled at 11am with Dr. Jenny Reichmann at [place] Disagree/Comply: Comply

## 2016-08-23 NOTE — Progress Notes (Signed)
Pre visit review using our clinic review tool, if applicable. No additional management support is needed unless otherwise documented below in the visit note. 

## 2016-08-23 NOTE — Assessment & Plan Note (Signed)
Likely due to post nasal gtt, declines cxr,  to f/u any worsening symptoms or concerns, for cough med prn

## 2016-08-23 NOTE — Patient Instructions (Signed)
Please take all new medication as prescribed - the antibiotic (sent to Shari Gonzalez's pharmacy), the nasacort, and the cough medicine (the latter done in hardcopy)  Shari Gonzalez continue all other medications as before, and refills have been done if requested.  Please have the pharmacy call with any other refills you may need.  Please continue your efforts at being more active, low cholesterol diet, and weight control  Please keep your appointments with your specialists as you may have planned

## 2016-08-23 NOTE — Assessment & Plan Note (Signed)
Mild to mod, for antibx course,  to f/u any worsening symptoms or concerns 

## 2016-08-23 NOTE — Telephone Encounter (Signed)
Medication sent to new pharmacy

## 2016-08-23 NOTE — Assessment & Plan Note (Signed)
Out of flonase, but ok to change to safer nasacort asd - done erx

## 2016-08-29 NOTE — Progress Notes (Signed)
   Subjective:    Patient ID: Shari Gonzalez, female    DOB: 02-May-1951, 65 y.o.   MRN: [id number]  HPI   Here with 8 days acute onset fever, facial pain, pressure, headache, general weakness and malaise, and greenish d/c, with mild ST and cough that is worse at night, but pt denies chest pain, wheezing, increased sob or doe, orthopnea, PND, increased LE swelling, palpitations, dizziness or syncope. Has 2 sick children at home. Also, Does have several wks ongoing nasal allergy symptoms with clearish congestion, itch and sneezing, without fever, pain, ST, cough, swelling or wheezing.  Pt denies polydipsia, polyuria Past Medical History:  Diagnosis Date  . Allergy    seasonal  . Breast cancer ([place])    left   . Cyst of left knee joint   . Diarrhea   . Heart murmur    Past Surgical History:  Procedure Laterality Date  . ABDOMINAL HYSTERECTOMY     complete with BSO  . BREAST RECONSTRUCTION WITH PLACEMENT OF TISSUE EXPANDER AND FLEX HD (ACELLULAR HYDRATED DERMIS)    . COLONOSCOPY  2006  . MASTECTOMY  July 2012   bilateral mastectomy    reports that she has never smoked. She has never used smokeless tobacco. She reports that she drinks about 1.2 oz of alcohol per week . She reports that she does not use drugs. family history includes Alzheimer's disease in her mother; Breast cancer in her maternal aunt; Diabetes in her paternal grandmother; Gout in her father; Kidney cancer in her father; Liver cancer in her paternal grandfather; Lung cancer in her father. Allergies  Allergen Reactions  . Dilaudid [Hydromorphone Hcl] Rash  . Guaifenesin Er Rash   Current Outpatient Prescriptions on File Prior to Visit  Medication Sig Dispense Refill  . cholecalciferol (VITAMIN D) 1000 units tablet Take 2,000 Units by mouth daily.    . Diclofenac Sodium 2 % SOLN Apply 1 pump twice daily. 112 g 3  . loratadine (CLARITIN) 10 MG tablet Take 10 mg by mouth as needed.     . Multiple Vitamins-Minerals  (MULTIVITAMIN WITH MINERALS) tablet Take 1 tablet by mouth daily.      . ondansetron (ZOFRAN) 4 MG tablet Take 1 tablet (4 mg total) by mouth every 8 (eight) hours as needed for nausea or vomiting (and diarrhea). 30 tablet 1  . OVER THE COUNTER MEDICATION daily. aminolase    . Probiotic Product (PROBIOTIC DAILY PO) Take by mouth.     No current facility-administered medications on file prior to visit.    Review of Systems .    Objective:   Physical Exam        Assessment & Plan:

## 2016-10-17 ENCOUNTER — Encounter: Payer: Self-pay | Admitting: Family Medicine

## 2016-10-17 ENCOUNTER — Encounter (INDEPENDENT_AMBULATORY_CARE_PROVIDER_SITE_OTHER): Payer: Self-pay

## 2016-10-17 ENCOUNTER — Ambulatory Visit (INDEPENDENT_AMBULATORY_CARE_PROVIDER_SITE_OTHER): Payer: Commercial Managed Care - HMO | Admitting: Family Medicine

## 2016-10-17 VITALS — BP 108/62 | HR 94 | Temp 98.9°F | Wt 119.8 lb

## 2016-10-17 DIAGNOSIS — J111 Influenza due to unidentified influenza virus with other respiratory manifestations: Secondary | ICD-10-CM

## 2016-10-17 DIAGNOSIS — R69 Illness, unspecified: Secondary | ICD-10-CM

## 2016-10-17 MED ORDER — PROMETHAZINE-DM 6.25-15 MG/5ML PO SYRP: 5.0000 mL | ORAL_SOLUTION | Freq: Four times a day (QID) | ORAL | 0 refills | Status: DC | PRN

## 2016-10-17 NOTE — Progress Notes (Signed)
SUBJECTIVE:  Shari Gonzalez is a 66 y.o. female pt of Dr. Glori Bickers, new to me, who present complaining of flu-like symptoms: fevers, chills, myalgias, congestion, sore throat and cough for 3 days. Denies dyspnea or wheezing.  Current Outpatient Prescriptions on File Prior to Visit  Medication Sig Dispense Refill  . cholecalciferol (VITAMIN D) 1000 units tablet Take 2,000 Units by mouth daily.    . Diclofenac Sodium 2 % SOLN Apply 1 pump twice daily. 112 g 3  . loratadine (CLARITIN) 10 MG tablet Take 10 mg by mouth as needed.     . Multiple Vitamins-Minerals (MULTIVITAMIN WITH MINERALS) tablet Take 1 tablet by mouth daily.      . ondansetron (ZOFRAN) 4 MG tablet Take 1 tablet (4 mg total) by mouth every 8 (eight) hours as needed for nausea or vomiting (and diarrhea). 30 tablet 1  . OVER THE COUNTER MEDICATION daily. aminolase    . Probiotic Product (PROBIOTIC DAILY PO) Take by mouth.    . triamcinolone (NASACORT AQ) 55 MCG/ACT AERO nasal inhaler Place 2 sprays into the nose daily. 1 Inhaler 12   No current facility-administered medications on file prior to visit.     Allergies  Allergen Reactions  . Dilaudid [Hydromorphone Hcl] Rash  . Guaifenesin Er Rash    Past Medical History:  Diagnosis Date  . Allergy    seasonal  . Breast cancer ([place])    left   . Cyst of left knee joint   . Diarrhea   . Heart murmur     Past Surgical History:  Procedure Laterality Date  . ABDOMINAL HYSTERECTOMY     complete with BSO  . BREAST RECONSTRUCTION WITH PLACEMENT OF TISSUE EXPANDER AND FLEX HD (ACELLULAR HYDRATED DERMIS)    . COLONOSCOPY  2006  . MASTECTOMY  July 2012   bilateral mastectomy    Family History  Problem Relation Age of Onset  . Alzheimer's disease Mother   . Kidney cancer Father   . Lung cancer Father   . Gout Father   . Diabetes Paternal Grandmother   . Breast cancer Maternal Aunt   . Colon cancer Neg Hx   . Colon polyps Neg Hx   . Esophageal cancer Neg Hx   .  Rectal cancer Neg Hx   . Stomach cancer Neg Hx   . Liver cancer Paternal Grandfather     Social History   Social History  . Marital status: Married    Spouse name: N/A  . Number of children: 2  . Years of education: N/A   Occupational History  .  [employer]. [employer]   Social History Main Topics  . Smoking status: Never Smoker  . Smokeless tobacco: Never Used  . Alcohol use 1.2 oz/week    2 Glasses of wine per week     Comment: Occasional  . Drug use: No  . Sexual activity: Yes    Birth control/ protection: None   Other Topics Concern  . Not on file   Social History Narrative  . No narrative on file   The PMH, PSH, Social History, Family History, Medications, and allergies have been reviewed in [facility], and have been updated if relevant.  OBJECTIVE:  BP 108/62   Pulse 94   Temp 98.9 F (37.2 C) (Oral)   Wt 119 lb 12 oz (54.3 kg)   SpO2 100%   BMI 19.93 kg/m   Appears moderately ill but not toxic; temperature as noted in vitals. Ears normal. Throat and  pharynx normal.  Neck supple. No adenopathy in the neck. Sinuses non tender. The chest is clear.  ASSESSMENT: Influenza  PLAN: Symptomatic therapy suggested: call prn if symptoms persist or worsen. Call or return to clinic prn if these symptoms worsen or fail to improve as anticipated.

## 2016-10-17 NOTE — Patient Instructions (Addendum)
Great to see you. Ok to use cough syrup as needed for cough.   Influenza, Adult Influenza, more commonly known as "the flu," is a viral infection that primarily affects the respiratory tract. The respiratory tract includes organs that help you breathe, such as the lungs, nose, and throat. The flu causes many common cold symptoms, as well as a high fever and body aches. The flu spreads easily from person to person (is contagious). Getting a flu shot (influenza vaccination) every year is the best way to prevent influenza. What are the causes? Influenza is caused by a virus. You can catch the virus by:  Breathing in droplets from an infected person's cough or sneeze.  Touching something that was recently contaminated with the virus and then touching your mouth, nose, or eyes. What increases the risk? The following factors may make you more likely to get the flu:  Not cleaning your hands frequently with soap and water or alcohol-based hand sanitizer.  Having close contact with many people during cold and flu season.  Touching your mouth, eyes, or nose without washing or sanitizing your hands first.  Not drinking enough fluids or not eating a healthy diet.  Not getting enough sleep or exercise.  Being under a high amount of stress.  Not getting a yearly (annual) flu shot. You may be at a higher risk of complications from the flu, such as a severe lung infection (pneumonia), if you:  Are over the age of 83.  Are pregnant.  Have a weakened disease-fighting system (immune system). You may have a weakened immune system if you:  Have HIV or AIDS.  Are undergoing chemotherapy.  Aretaking medicines that reduce the activity of (suppress) the immune system.  Have a long-term (chronic) illness, such as heart disease, kidney disease, diabetes, or lung disease.  Have a liver disorder.  Are obese.  Have anemia. What are the signs or symptoms? Symptoms of this condition typically  last 4-10 days and may include:  Fever.  Chills.  Headache, body aches, or muscle aches.  Sore throat.  Cough.  Runny or congested nose.  Chest discomfort and cough.  Poor appetite.  Weakness or tiredness (fatigue).  Dizziness.  Nausea or vomiting. How is this diagnosed? This condition may be diagnosed based on your medical history and a physical exam. Your health care provider may do a nose or throat swab test to confirm the diagnosis. How is this treated? If influenza is detected early, you can be treated with antiviral medicine that can reduce the length of your illness and the severity of your symptoms. This medicine may be given by mouth (orally) or through an IV tube that is inserted in one of your veins. The goal of treatment is to relieve symptoms by taking care of yourself at home. This may include taking over-the-counter medicines, drinking plenty of fluids, and adding humidity to the air in your home. In some cases, influenza goes away on its own. Severe influenza or complications from influenza may be treated in a hospital. Follow these instructions at home:  Take over-the-counter and prescription medicines only as told by your health care provider.  Use a cool mist humidifier to add humidity to the air in your home. This can make breathing easier.  Rest as needed.  Drink enough fluid to keep your urine clear or pale yellow.  Cover your mouth and nose when you cough or sneeze.  Wash your hands with soap and water often, especially after you cough or  sneeze. If soap and water are not available, use hand sanitizer.  Stay home from work or school as told by your health care provider. Unless you are visiting your health care provider, try to avoid leaving home until your fever has been gone for 24 hours without the use of medicine.  Keep all follow-up visits as told by your health care provider. This is important. How is this prevented?  Getting an annual flu  shot is the best way to avoid getting the flu. You may get the flu shot in late summer, fall, or winter. Ask your health care provider when you should get your flu shot.  Wash your hands often or use hand sanitizer often.  Avoid contact with people who are sick during cold and flu season.  Eat a healthy diet, drink plenty of fluids, get enough sleep, and exercise regularly. Contact a health care provider if:  You develop new symptoms.  You have:  Chest pain.  Diarrhea.  A fever.  Your cough gets worse.  You produce more mucus.  You feel nauseous or you vomit. Get help right away if:  You develop shortness of breath or difficulty breathing.  Your skin or nails turn a bluish color.  You have severe pain or stiffness in your neck.  You develop a sudden headache or sudden pain in your face or ear.  You cannot stop vomiting. This information is not intended to replace advice given to you by your health care provider. Make sure you discuss any questions you have with your health care provider. Document Released: 09/09/2000 Document Revised: 02/18/2016 Document Reviewed: 07/07/2015 Elsevier Interactive Patient Education  2017 [employer].

## 2016-10-20 ENCOUNTER — Telehealth: Payer: Self-pay

## 2016-10-20 MED ORDER — HYDROCOD POLST-CPM POLST ER 10-8 MG/5ML PO SUER: 5.0000 mL | Freq: Two times a day (BID) | ORAL | 0 refills | Status: DC | PRN

## 2016-10-20 NOTE — Telephone Encounter (Signed)
PLEASE NOTE: All timestamps contained within this report are represented as [place] Standard Time. CONFIDENTIALTY NOTICE: This fax transmission is intended only for the addressee. It contains information that is legally privileged, confidential or otherwise protected from use or disclosure. If you are not the intended recipient, you are strictly prohibited from reviewing, disclosing, copying using or disseminating any of this information or taking any action in reliance on or regarding this information. If you have received this fax in error, please notify us immediately by telephone so that we can arrange for its return to [place]. Phone: [phone], Toll-Free: [phone], Fax: [phone] Page: 1 of 2 Call Id: [id number] [place] Patient Name: Shari Gonzalez Gender: Female DOB: 12/24/1950 Age: 66 Y 29 D Return Phone Number: [phone] (Primary), [phone] (Secondary) City/State/Zip: [place] Client [place] [facility] - Client Client Site [place] - Day Physician Arnette Norris - MD Who Is Calling Patient / Member / Family / Caregiver Call Type Triage / Clinical Relationship To Patient Self Return Phone Number [phone] (Primary) Chief Complaint Fever (non urgent symptom) (> THREE MONTHS) Reason for Call Symptomatic / Request for Health Information Initial Comment Caller was seen Monday and dx with flu. Temp is 103.2 Appointment Disposition EMR Appointment Attempted - Not Scheduled Info pasted into Epic No Nurse Assessment Nurse: Ysidro Evert, RN, Levada Dy Date/Time (Eastern Time): 10/20/2016 4:50:26 PM Confirm and document reason for call. If symptomatic, describe symptoms. ---Caller states she was at the doctor and was told she had the flu and she has a fever of 103.2. She has a cough, headache and fever. Does the PT have any chronic conditions? (i.e.  diabetes, asthma, etc.) ---No Guidelines Guideline Title Affirmed Question Influenza Follow-up Call Fever present > 3 days (72 hours) Disp. Time Eilene Ghazi Time) Disposition Final User 10/20/2016 5:03:04 PM See Physician within 24 Hours Yes Ysidro Evert, RN, Levada Dy Referrals REFERRED TO PCP OFFICE Care Advice Given Per Guideline SEE PHYSICIAN WITHIN 24 HOURS: * IF OFFICE WILL BE OPEN: You need to be seen within the next 24 hours. Call your doctor when the office opens, and make an appointment. FEVER MEDICINES: ACETAMINOPHEN (E.G., TYLENOL): * Another choice is to take 1,000 mg (two 500 mg pills) every 8 hours as needed. Each Extra Strength Tylenol pill has 500 mg of acetaminophen. The most you should take each day is 3,000 mg (6 Extra Strength pills a day). * Take 400 mg (two 200 mg pills) by mouth every 6 hours as needed. IBUPROFEN (E.G., MOTRIN, ADVIL): * Treat fevers above 101 F (38.3 C). * Take 650 mg (two 325 mg pills) by mouth every 4-6 hours as needed. Each Regular Strength Tylenol pill has 325 mg of acetaminophen. The most you should take each day is 3,250 mg (10 Regular Strength pills a day). HOME REMEDY - HARD CANDY: Hard candy works just as well as a medicine-flavored OTC cough drops. * Cough drops also have the advantage of portability - you can carry them with you. * Cough drops are available over-the-counter (OTC). CALL BACK IF: * Breathing difficulty develops * You become worse. CARE ADVICE given per Influenza Follow-Up Call (Adult) guideline. PLEASE NOTE: All timestamps contained within this report are represented as [place] Standard Time. CONFIDENTIALTY NOTICE: This fax transmission is intended only for the addressee. It contains information that is legally privileged, confidential or otherwise protected from use or disclosure. If you are not the intended  recipient, you are strictly prohibited from reviewing, disclosing, copying using or disseminating any of this information or  taking any action in reliance on or regarding this information. If you have received this fax in error, please notify us immediately by telephone so that we can arrange for its return to [place]. Phone: [phone], Toll-Free: [phone], Fax: [phone] Page: 2 of 2 Call Id: [id number]

## 2016-10-20 NOTE — Telephone Encounter (Signed)
Spoke to pt and informed her Rx is available for pickup from the front desk 

## 2016-10-20 NOTE — Telephone Encounter (Signed)
Tussionex rx printed.  Please verify that she is NOT allergic to this as dilaudid is listed as an allergy list.  While this is a different narcotic, they are also somewhat similar.

## 2016-10-20 NOTE — Telephone Encounter (Signed)
Pt left v/m; pt was seen 10/17/16; cough is no better and cough med,promethazine DM is not helping cough. Pt had previously had hydrocodone that did help her cough. Pt is still running fever at 100.4 this morning. Pt request cb. [place].

## 2016-10-21 ENCOUNTER — Ambulatory Visit: Payer: Commercial Managed Care - HMO | Admitting: Family Medicine

## 2016-10-21 ENCOUNTER — Ambulatory Visit (INDEPENDENT_AMBULATORY_CARE_PROVIDER_SITE_OTHER): Payer: Commercial Managed Care - HMO | Admitting: Family Medicine

## 2016-10-21 ENCOUNTER — Ambulatory Visit (INDEPENDENT_AMBULATORY_CARE_PROVIDER_SITE_OTHER)
Admission: RE | Admit: 2016-10-21 | Discharge: 2016-10-21 | Disposition: A | Discharge status: Home / Self Care | Payer: Commercial Managed Care - HMO | Source: Ambulatory Visit | Attending: Family Medicine | Admitting: Family Medicine

## 2016-10-21 ENCOUNTER — Encounter: Payer: Self-pay | Admitting: Family Medicine

## 2016-10-21 VITALS — BP 110/60 | HR 93 | Temp 98.9°F | Ht 63.5 in | Wt 123.2 lb

## 2016-10-21 DIAGNOSIS — R059 Cough, unspecified: Secondary | ICD-10-CM

## 2016-10-21 DIAGNOSIS — R05 Cough: Secondary | ICD-10-CM

## 2016-10-21 MED ORDER — LEVOFLOXACIN 750 MG PO TABS: 750.0000 mg | ORAL_TABLET | Freq: Every day | ORAL | 0 refills | Status: DC

## 2016-10-21 NOTE — Progress Notes (Signed)
Pre visit review using our clinic review tool, if applicable. No additional management support is needed unless otherwise documented below in the visit note. 

## 2016-10-21 NOTE — Progress Notes (Signed)
   Subjective:    Patient ID: Shari Gonzalez, female    DOB: 07/25/1951, 66 y.o.   MRN: [id number]  HPI    66 year old female pt of Dr. Marliss Coots presents for continued symptoms.   She was seen on 10/17/2016 for fever, chills, myalgia, congestion, cough.  Felt likely flu.  Treated with cough syrup.  Not treated with tamiflu.  Today she reports:  Continued fever Yesterday temp 103.4. She has had increase in cough, brownish phlegm or dry cough. Some congestion.  Scratchy throat from cough. Still with body aches with fever.  Headache.  She feels weak all over.   She has been resting.. No SOB, no wheeze.   She has been using theraflu, ibuprofen. Prescription cough med did not help. Dr. Glori Bickers sent in Tussionex.   Her concern is she did not have flu test... Concerned it could be something else  No face pain. No ear pain.    Review of Systems  Constitutional: Positive for chills, fatigue and fever.  HENT: Negative for ear pain.   Eyes: Negative for pain.  Respiratory: Negative for chest tightness and shortness of breath.   Cardiovascular: Negative for chest pain, palpitations and leg swelling.  Gastrointestinal: Negative for abdominal pain.  Genitourinary: Negative for dysuria.       Objective:   Physical Exam  Constitutional: Vital signs are normal. She appears well-developed and well-nourished. She is cooperative.  Non-toxic appearance. She does not appear ill. No distress.  HENT:  Head: Normocephalic.  Right Ear: Hearing, tympanic membrane, external ear and ear canal normal. Tympanic membrane is not erythematous, not retracted and not bulging.  Left Ear: Hearing, tympanic membrane, external ear and ear canal normal. Tympanic membrane is not erythematous, not retracted and not bulging.  Nose: Mucosal edema and rhinorrhea present. Right sinus exhibits no maxillary sinus tenderness and no frontal sinus tenderness. Left sinus exhibits no maxillary sinus tenderness and no  frontal sinus tenderness.  Mouth/Throat: Uvula is midline, oropharynx is clear and moist and mucous membranes are normal.  Eyes: Conjunctivae, EOM and lids are normal. Pupils are equal, round, and reactive to light. Lids are everted and swept, no foreign bodies found.  Neck: Trachea normal and normal range of motion. Neck supple. Carotid bruit is not present. No thyroid mass and no thyromegaly present.  Cardiovascular: Normal rate, regular rhythm, S1 normal, S2 normal, normal heart sounds, intact distal pulses and normal pulses.  Exam reveals no gallop and no friction rub.   No murmur heard. Pulmonary/Chest: Effort normal. No tachypnea. No respiratory distress. She has no wheezes. She has rhonchi in the right lower field and the left lower field.  Constant cough  Neurological: She is alert.  Skin: Skin is warm, dry and intact. No rash noted.  Psychiatric: Her speech is normal and behavior is normal. Judgment normal. Her mood appears not anxious. Cognition and memory are normal. She does not exhibit a depressed mood.          Assessment & Plan:

## 2016-10-21 NOTE — Patient Instructions (Addendum)
Rest, fluids.  Use cough suppressant at night as needed.  Complete antibiotics x 5 days.  Call if fever continuing despite antibiotics.  Go to ER if shortness of breath.  I will call with radiologist reading of CXR.

## 2016-10-31 ENCOUNTER — Telehealth: Payer: Self-pay | Admitting: Family Medicine

## 2016-10-31 ENCOUNTER — Ambulatory Visit (INDEPENDENT_AMBULATORY_CARE_PROVIDER_SITE_OTHER)
Admission: RE | Admit: 2016-10-31 | Discharge: 2016-10-31 | Disposition: A | Discharge status: Home / Self Care | Payer: Commercial Managed Care - HMO | Source: Ambulatory Visit | Attending: Family Medicine | Admitting: Family Medicine

## 2016-10-31 ENCOUNTER — Encounter: Payer: Self-pay | Admitting: Family Medicine

## 2016-10-31 ENCOUNTER — Ambulatory Visit
Admission: RE | Admit: 2016-10-31 | Discharge: 2016-10-31 | Disposition: A | Discharge status: Home / Self Care | Payer: Commercial Managed Care - HMO | Source: Ambulatory Visit | Attending: Family Medicine | Admitting: Family Medicine

## 2016-10-31 ENCOUNTER — Ambulatory Visit (INDEPENDENT_AMBULATORY_CARE_PROVIDER_SITE_OTHER): Payer: Commercial Managed Care - HMO | Admitting: Family Medicine

## 2016-10-31 VITALS — BP 112/66 | HR 82 | Temp 98.7°F | Ht 63.5 in | Wt 121.2 lb

## 2016-10-31 DIAGNOSIS — J189 Pneumonia, unspecified organism: Secondary | ICD-10-CM | POA: Diagnosis not present

## 2016-10-31 DIAGNOSIS — R0781 Pleurodynia: Secondary | ICD-10-CM | POA: Insufficient documentation

## 2016-10-31 DIAGNOSIS — C50912 Malignant neoplasm of unspecified site of left female breast: Secondary | ICD-10-CM

## 2016-10-31 MED ORDER — HYDROCOD POLST-CPM POLST ER 10-8 MG/5ML PO SUER: 5.0000 mL | Freq: Two times a day (BID) | ORAL | 0 refills | Status: DC | PRN

## 2016-10-31 NOTE — Progress Notes (Signed)
Pre visit review using our clinic review tool, if applicable. No additional management support is needed unless otherwise documented below in the visit note. 

## 2016-10-31 NOTE — Telephone Encounter (Signed)
Patient Name: Shari Gonzalez  DOB: [birth year]    Initial Comment Caller states c/o lung pain when taking a deep breath, recently treated for pneumonia.   Nurse Assessment  Nurse: Verlin Fester RN, Stanton Kidney Date/Time (Eastern Time): 10/31/2016 8:54:39 AM  Confirm and document reason for call. If symptomatic, describe symptoms. ---Patient states she had recent pneumonia and she is having pain on the right chest when she takes a deep breath  Does the patient have any new or worsening symptoms? ---Yes  Will a triage be completed? ---Yes  Related visit to physician within the last 2 weeks? ---No  Does the PT have any chronic conditions? (i.e. diabetes, asthma, etc.) ---No  Is this a behavioral health or substance abuse call? ---No     Guidelines    Guideline Title Affirmed Question Affirmed Notes  Chest Pain Taking a deep breath makes pain worse    Final Disposition User   Go to ED Now (or PCP triage) Verlin Fester, RN, [facility]    Referrals  GO TO FACILITY UNDECIDED   Disagree/Comply: Dorothea Glassman ED outcome and wants to be seen in the office

## 2016-10-31 NOTE — Telephone Encounter (Signed)
Please put her in my 4:15 slot-thanks

## 2016-10-31 NOTE — Telephone Encounter (Signed)
Pt has appt today at 2:30p

## 2016-10-31 NOTE — Assessment & Plan Note (Signed)
Suspect from CAP and cough  Tenderness on exam  cxr and rib films today and update with rad interpretation  Disc use of warm comp and deep breaths to prevent atelectasis  tussionex for cough  Ibuprofen for pain

## 2016-10-31 NOTE — Patient Instructions (Signed)
Chest xray and rib xray today  We will alert you with results  Keep resting  Grab a pillow and take a deep breath every 30 minutes while awake Continue ibuprofen and tussionex as needed   Drink lots of fluids

## 2016-10-31 NOTE — Assessment & Plan Note (Signed)
Clinically improving s/p tx with 5 d of high dose levaquin  However exp some cwp -R lower ribs anterolaterally  Reassuring exam  Refilled tussionex for cough  cxr and rib films today - pending rad results  Disc use of warm compress and need to take a deep breath with support every 30 min to prevent atelectasis  Update /seek care if symptoms suddenly worsen Update if not starting to improve in a week or if worsening

## 2016-10-31 NOTE — Assessment & Plan Note (Signed)
Neg mammogram 6/17 Followed by oncology

## 2016-10-31 NOTE — Progress Notes (Signed)
Subjective:    Patient ID: Shari Gonzalez, female    DOB: 1951/05/22, 66 y.o.   MRN: [id number]  HPI Here with ongoing cough since late last mo  Thought to be influenza when seen 1/22 by Dr Deborra Medina and tx with cough med (promethazine dm) Then returned worse on 1/26 and tx with abx by Dr Diona Browner - patchy infiltrates were seen on cxr   (tx with levofloxacin 750 daily)  Was px tussionex for worse cough by Dr Deborra Medina  Today temp is 98.7  (per pt she usually runs 97.4)  Pulse ox 100%  Today she presents with rib pain from coughing   Some improvement overall- was starting to feel better  Done with 5 days of levaquin 750   Friday night she started feeling discomfort and pain in R lower /ant ribs Was worried she may have fractured a rib - pain was severe She took ibuprofen - helped some  Next am hurt to take a deep breath  Friend told her she may have pleurisy  Took ibuprofen all weekend   Still tender over the area   Still no energy  Light headed at times  BP Readings from Last 3 Encounters:  10/31/16 112/66  10/21/16 110/60  10/17/16 108/62   was in bed for over a week   Thinks her temp is still above nl now but was ok this am  Has not coughed up any blood  ? What color the phlegm is (was brown in the beginning)  Cough is 50% better  Throat is raw from cough-not bad  Ears are ok  Sinus pain is improved since starting the levaquin   Patient Active Problem List   Diagnosis Date Noted  . CAP (community acquired pneumonia) 10/31/2016  . Rib pain on right side 10/31/2016  . Cough 08/23/2016  . Allergic rhinitis 08/23/2016  . Patellar tendinitis of left knee 03/08/2016  . Trigger thumb of left hand 12/01/2015  . Perimeniscal cyst of left knee 11/10/2015  . Breast cancer, left breast ([place]) 05/29/2014  . Other malaise and fatigue 04/08/2013  . Bloating 04/08/2013   Past Medical History:  Diagnosis Date  . Allergy    seasonal  . Breast cancer ([place])    left   . Cyst of  left knee joint   . Diarrhea   . Heart murmur    Past Surgical History:  Procedure Laterality Date  . ABDOMINAL HYSTERECTOMY     complete with BSO  . BREAST RECONSTRUCTION WITH PLACEMENT OF TISSUE EXPANDER AND FLEX HD (ACELLULAR HYDRATED DERMIS)    . COLONOSCOPY  2006  . MASTECTOMY  July 2012   bilateral mastectomy   Social History  Substance Use Topics  . Smoking status: Never Smoker  . Smokeless tobacco: Never Used  . Alcohol use 1.2 oz/week    2 Glasses of wine per week     Comment: Occasional   Family History  Problem Relation Age of Onset  . Alzheimer's disease Mother   . Kidney cancer Father   . Lung cancer Father   . Gout Father   . Diabetes Paternal Grandmother   . Breast cancer Maternal Aunt   . Colon cancer Neg Hx   . Colon polyps Neg Hx   . Esophageal cancer Neg Hx   . Rectal cancer Neg Hx   . Stomach cancer Neg Hx   . Liver cancer Paternal Grandfather    Allergies  Allergen Reactions  . Dilaudid [Hydromorphone Hcl] Rash  .  Guaifenesin Er Rash   Current Outpatient Prescriptions on File Prior to Visit  Medication Sig Dispense Refill  . cholecalciferol (VITAMIN D) 1000 units tablet Take 2,000 Units by mouth daily.    . Diclofenac Sodium 2 % SOLN Apply 1 pump twice daily. 112 g 3  . loratadine (CLARITIN) 10 MG tablet Take 10 mg by mouth as needed.     . Multiple Vitamins-Minerals (MULTIVITAMIN WITH MINERALS) tablet Take 1 tablet by mouth daily.      . ondansetron (ZOFRAN) 4 MG tablet Take 1 tablet (4 mg total) by mouth every 8 (eight) hours as needed for nausea or vomiting (and diarrhea). 30 tablet 1  . OVER THE COUNTER MEDICATION daily. aminolase    . Probiotic Product (PROBIOTIC DAILY PO) Take by mouth.    . triamcinolone (NASACORT AQ) 55 MCG/ACT AERO nasal inhaler Place 2 sprays into the nose daily. 1 Inhaler 12   No current facility-administered medications on file prior to visit.      Review of Systems Review of Systems  Constitutional: Negative  for fever, appetite change, fatigue and unexpected weight change.  Eyes: Negative for pain and visual disturbance.  ENT neg for throat or sinus pain  Respiratory: Negative for wheeze and shortness of breath.  pos for pleuritic pain and improving cough Cardiovascular: Negative for cp or palpitations    Gastrointestinal: Negative for nausea, diarrhea and constipation.  Genitourinary: Negative for urgency and frequency.  Skin: Negative for pallor or rash   Neurological: Negative for weakness, light-headedness, numbness and headaches.  Hematological: Negative for adenopathy. Does not bruise/bleed easily.  Psychiatric/Behavioral: Negative for dysphoric mood. The patient is not nervous/anxious.         Objective:   Physical Exam  Constitutional: She appears well-developed and well-nourished. No distress.  Slim and fatigued appearing   HENT:  Head: Normocephalic and atraumatic.  Mouth/Throat: Oropharynx is clear and moist.  Boggy nares pnd  Eyes: Conjunctivae and EOM are normal. Pupils are equal, round, and reactive to light. Right eye exhibits no discharge. Left eye exhibits no discharge. No scleral icterus.  Neck: Normal range of motion. Neck supple. No JVD present. Carotid bruit is not present. No thyromegaly present.  Cardiovascular: Normal rate, regular rhythm, normal heart sounds and intact distal pulses.  Exam reveals no gallop.   Pulmonary/Chest: Effort normal and breath sounds normal. No respiratory distress. She has no wheezes. She has no rales. She exhibits tenderness.  No crackles  No rales /rhonchi  Good air exch   cw tenderness over R anterolateral lower ribs  No crepitus or skin change    Abdominal: Soft. Bowel sounds are normal. She exhibits no distension, no abdominal bruit and no mass. There is no tenderness.  Musculoskeletal: She exhibits no edema.  Lymphadenopathy:    She has no cervical adenopathy.  Neurological: She is alert. She has normal reflexes.  Skin: Skin  is warm and dry. No rash noted.  Psychiatric: She has a normal mood and affect.          Assessment & Plan:   Problem List Items Addressed This Visit      Respiratory   CAP (community acquired pneumonia)    Clinically improving s/p tx with 5 d of high dose levaquin  However exp some cwp -R lower ribs anterolaterally  Reassuring exam  Refilled tussionex for cough  cxr and rib films today - pending rad results  Disc use of warm compress and need to take a deep breath with support  every 30 min to prevent atelectasis  Update /seek care if symptoms suddenly worsen Update if not starting to improve in a week or if worsening        Relevant Medications   chlorpheniramine-HYDROcodone (TUSSIONEX PENNKINETIC ER) 10-8 MG/5ML SUER   Other Relevant Orders   DG Chest 2 View (Completed)     Other   Breast cancer, left breast ([place])    Neg mammogram 6/17 Followed by oncology      Rib pain on right side - Primary    Suspect from CAP and cough  Tenderness on exam  cxr and rib films today and update with rad interpretation  Disc use of warm comp and deep breaths to prevent atelectasis  tussionex for cough  Ibuprofen for pain       Relevant Orders   DG Chest 2 View (Completed)   DG Ribs Unilateral Right (Completed)

## 2016-11-01 ENCOUNTER — Telehealth: Payer: Self-pay | Admitting: Family Medicine

## 2016-11-01 MED ORDER — LEVOFLOXACIN 750 MG PO TABS: ORAL_TABLET | ORAL | 0 refills | Status: DC

## 2016-11-01 NOTE — Telephone Encounter (Signed)
-----   Message from Abner Greenspan, MD sent at 10/31/2016  4:48 PM EST ----- cxr is improving-this is re assuring  Not completely resolved but improved (like I told her sometimes cxr lags behind clinical picture as well) There is some fluid in right lower lung- but I still think pain is more from ribs  I want her to take 3 more days of levaquin to be on the safe side =please call in levaquin 750 mg once daily by mouth #3 no refills  Radiology does want to repeat this one more time (I recommend 2-4 weeks)- to make sure it resolved entirely- she can call to make xray appt for this  Rib films are still pending

## 2016-11-01 NOTE — Telephone Encounter (Signed)
Pt notified of xray results and Dr. Marliss Coots comments, Rx sent to pharmacy and pt will call back in a few weeks for a repeat xray

## 2016-11-01 NOTE — Telephone Encounter (Signed)
Pt called to get x-ray results from 10/31/16. Pt is requesting a cb at home number.

## 2016-11-11 ENCOUNTER — Telehealth: Payer: Self-pay | Admitting: Family Medicine

## 2016-11-11 DIAGNOSIS — J189 Pneumonia, unspecified organism: Secondary | ICD-10-CM

## 2016-11-11 NOTE — Telephone Encounter (Signed)
I put the order in for Monday Thanks

## 2016-11-11 NOTE — Telephone Encounter (Signed)
Pt would like orders for repeat xray.  She wil be in on Monday for it  Thanks

## 2016-11-11 NOTE — Telephone Encounter (Signed)
Pt notified xray order in and she can come in on Monday

## 2016-11-14 ENCOUNTER — Ambulatory Visit (INDEPENDENT_AMBULATORY_CARE_PROVIDER_SITE_OTHER)
Admission: RE | Admit: 2016-11-14 | Discharge: 2016-11-14 | Disposition: A | Discharge status: Home / Self Care | Payer: Commercial Managed Care - HMO | Source: Ambulatory Visit | Attending: Family Medicine | Admitting: Family Medicine

## 2016-11-14 DIAGNOSIS — J189 Pneumonia, unspecified organism: Secondary | ICD-10-CM

## 2016-11-16 NOTE — Assessment & Plan Note (Signed)
Eval with CXR, pending. Prolonged course and not improving. Treat with antibiotics.

## 2017-01-11 ENCOUNTER — Encounter: Payer: Self-pay | Admitting: Family Medicine

## 2017-01-11 ENCOUNTER — Ambulatory Visit (INDEPENDENT_AMBULATORY_CARE_PROVIDER_SITE_OTHER): Payer: Commercial Managed Care - HMO | Admitting: Family Medicine

## 2017-01-11 VITALS — BP 108/62 | HR 80 | Temp 98.4°F | Wt 117.8 lb

## 2017-01-11 DIAGNOSIS — J069 Acute upper respiratory infection, unspecified: Secondary | ICD-10-CM

## 2017-01-11 MED ORDER — PROMETHAZINE-DM 6.25-15 MG/5ML PO SYRP: 2.5000 mL | ORAL_SOLUTION | Freq: Four times a day (QID) | ORAL | 0 refills | Status: DC | PRN

## 2017-01-11 MED ORDER — AZITHROMYCIN 250 MG PO TABS: ORAL_TABLET | ORAL | 0 refills | Status: DC

## 2017-01-11 NOTE — Progress Notes (Signed)
Pre visit review using our clinic review tool, if applicable. No additional management support is needed unless otherwise documented below in the visit note. 

## 2017-01-11 NOTE — Patient Instructions (Signed)
I think you have a viral illness. If you are not better in 2-3 days, please start the antibiotic.   If you are feeling worse or no improvement in 5-7 days, please schedule and appointment with Dr. Glori Bickers.   Continue ibuprofen, nasal spray, increase fluids

## 2017-01-11 NOTE — Progress Notes (Signed)
Subjective:    Patient ID: Shari Gonzalez, female    DOB: Aug 26, 1951, 66 y.o.   MRN: [id number]  HPI This is a 66 yo female who presents today with fever and cough x 2 days. Husband also sick at home with similar symptoms. Temperature was 101 two days ago and 101 yesterday and 102 this morning. Took ibuprofen and fever broke. Sudden onset, has felt achy. Does not feel like she did when she had pneumonia. No sore throat (throat feels scratchy) or headache. Some clear nasal drainage, cough is mostly dry. No wheezing. No SOB. Has also been around 1,4 yo grandchildren; they have not seemed ill.  Had pneumonia earlier this year (on xray) and repeat xray showed near complete resolution of airspace densities with no pleural effusion or pneumothorax (11/14/16). Prior to last couple of days, she felt that pneumonia had completely resolved, although it was slow going. She had resumed her gym activity and appetite was normal.   Past Medical History:  Diagnosis Date  . Allergy    seasonal  . Breast cancer ([place])    left   . Cyst of left knee joint   . Diarrhea   . Heart murmur    Past Surgical History:  Procedure Laterality Date  . ABDOMINAL HYSTERECTOMY     complete with BSO  . BREAST RECONSTRUCTION WITH PLACEMENT OF TISSUE EXPANDER AND FLEX HD (ACELLULAR HYDRATED DERMIS)    . COLONOSCOPY  2006  . MASTECTOMY  July 2012   bilateral mastectomy   Family History  Problem Relation Age of Onset  . Alzheimer's disease Mother   . Kidney cancer Father   . Lung cancer Father   . Gout Father   . Diabetes Paternal Grandmother   . Breast cancer Maternal Aunt   . Colon cancer Neg Hx   . Colon polyps Neg Hx   . Esophageal cancer Neg Hx   . Rectal cancer Neg Hx   . Stomach cancer Neg Hx   . Liver cancer Paternal Grandfather    Social History  Substance Use Topics  . Smoking status: Never Smoker  . Smokeless tobacco: Never Used  . Alcohol use 1.2 oz/week    2 Glasses of wine per week   Comment: Occasional      Review of Systems  Constitutional: Positive for chills, fatigue and fever.  HENT: Positive for postnasal drip. Negative for ear pain, sinus pressure and sore throat (scratchy).   Respiratory: Positive for cough (dry). Negative for shortness of breath and wheezing.   Cardiovascular: Negative for chest pain.  Neurological: Negative for headaches.       Objective:   Physical Exam  Constitutional: She is oriented to person, place, and time. She appears well-developed and well-nourished. No distress.  HENT:  Head: Normocephalic and atraumatic.  Right Ear: Tympanic membrane, external ear and ear canal normal.  Left Ear: Tympanic membrane, external ear and ear canal normal.  Nose: Nose normal.  Mouth/Throat: Uvula is midline. Posterior oropharyngeal erythema present. No oropharyngeal exudate or posterior oropharyngeal edema.  Eyes: Conjunctivae are normal.  Neck: Normal range of motion. Neck supple.  Cardiovascular: Normal rate, regular rhythm and normal heart sounds.   Pulmonary/Chest: Effort normal and breath sounds normal. No respiratory distress. She has no wheezes. She has no rales.  Occasional dry cough observed.   Lymphadenopathy:    She has no cervical adenopathy.  Neurological: She is alert and oriented to person, place, and time.  Skin: Skin is warm and dry.  She is not diaphoretic.  Psychiatric: She has a normal mood and affect. Her behavior is normal. Judgment and thought content normal.  Vitals reviewed.     BP 108/62 (BP Location: Right Arm, Patient Position: Sitting, Cuff Size: Normal)   Pulse 80   Temp 98.4 F (36.9 C) (Oral)   Wt 117 lb 12 oz (53.4 kg)   SpO2 97%   BMI 20.53 kg/m  Wt Readings from Last 3 Encounters:  01/11/17 117 lb 12 oz (53.4 kg)  10/31/16 121 lb 4 oz (55 kg)  10/21/16 123 lb 4 oz (55.9 kg)       Assessment & Plan:  Discussed with Dr. Glori Bickers who agreed with plan 1. URI with cough and congestion - likely viral,  discussed symptomatic treatment and provided wait and see antibiotic for her to start if she is not better in 2-3 days. Low threshold for starting antibiotic given pneumonia a couple of months ago.  - RTC precautions reviewed - promethazine-dextromethorphan (PROMETHAZINE-DM) 6.25-15 MG/5ML syrup; Take 2.5 mLs by mouth 4 (four) times daily as needed for cough.  Dispense: 180 mL; Refill: 0 - azithromycin (ZITHROMAX) 250 MG tablet; Take 2 tabs PO x 1 dose, then 1 tab PO QD x 4 days  Dispense: 6 tablet; Refill: 0   Clarene Reamer, FNP-BC  [facility], [facility]  01/11/2017 11:47 AM

## 2017-04-12 ENCOUNTER — Other Ambulatory Visit: Payer: Self-pay | Admitting: Obstetrics & Gynecology

## 2017-04-12 DIAGNOSIS — Z1231 Encounter for screening mammogram for malignant neoplasm of breast: Secondary | ICD-10-CM

## 2017-04-13 NOTE — Telephone Encounter (Signed)
Opened in error

## 2017-05-09 ENCOUNTER — Ambulatory Visit: Payer: Commercial Managed Care - HMO

## 2017-05-18 ENCOUNTER — Ambulatory Visit: Payer: Commercial Managed Care - HMO

## 2017-05-23 ENCOUNTER — Ambulatory Visit
Admission: RE | Admit: 2017-05-23 | Discharge: 2017-05-23 | Disposition: A | Discharge status: Home / Self Care | Payer: Commercial Managed Care - HMO | Source: Ambulatory Visit | Attending: Obstetrics & Gynecology | Admitting: Obstetrics & Gynecology

## 2017-05-23 ENCOUNTER — Other Ambulatory Visit: Payer: Self-pay | Admitting: Obstetrics & Gynecology

## 2017-05-23 ENCOUNTER — Encounter (INDEPENDENT_AMBULATORY_CARE_PROVIDER_SITE_OTHER): Payer: Self-pay

## 2017-05-23 DIAGNOSIS — Z1231 Encounter for screening mammogram for malignant neoplasm of breast: Secondary | ICD-10-CM

## 2017-11-27 NOTE — Progress Notes (Signed)
[name] [address], [place] [address] Phone: [phone] Subjective:      CC: Right thumb pain  [id number]  Shari Gonzalez is a 67 y.o. female coming in with complaint of right thumb pain.  Found to have trigger finger initially 2 years ago but this is on the last on the contralateral side.  Patient responded well to injection.  Patient states that her pain began 2 weeks ago in the right thumb. She has pain in the MCP and IP joints.  States it affects daily activities worse in the morning     Past Medical History:  Diagnosis Date  . Allergy    seasonal  . Breast cancer ([place])    left   . Cyst of left knee joint   . Diarrhea   . Heart murmur    Past Surgical History:  Procedure Laterality Date  . ABDOMINAL HYSTERECTOMY     complete with BSO  . AUGMENTATION MAMMAPLASTY Bilateral   . BREAST RECONSTRUCTION WITH PLACEMENT OF TISSUE EXPANDER AND FLEX HD (ACELLULAR HYDRATED DERMIS)    . COLONOSCOPY  2006  . MASTECTOMY Left July 2012    mastectomy   Social History   Socioeconomic History  . Marital status: Married    Spouse name: None  . Number of children: 2  . Years of education: None  . Highest education level: None  Social Needs  . Financial resource strain: None  . Food insecurity - worry: None  . Food insecurity - inability: None  . Transportation needs - medical: None  . Transportation needs - non-medical: None  Occupational History    Employer: [employer]  Tobacco Use  . Smoking status: Never Smoker  . Smokeless tobacco: Never Used  Substance and Sexual Activity  . Alcohol use: Yes    Alcohol/week: 1.2 oz    Types: 2 Glasses of wine per week    Comment: Occasional  . Drug use: No  . Sexual activity: Yes    Birth control/protection: None  Other Topics Concern  . None  Social History Narrative  . None   Allergies  Allergen Reactions  . Dilaudid [Hydromorphone Hcl] Rash  . Guaifenesin Er Rash     Family History  Problem Relation Age of Onset  . Alzheimer's disease Mother   . Kidney cancer Father   . Lung cancer Father   . Gout Father   . Diabetes Paternal Grandmother   . Breast cancer Maternal Aunt   . Liver cancer Paternal Grandfather   . Colon cancer Neg Hx   . Colon polyps Neg Hx   . Esophageal cancer Neg Hx   . Rectal cancer Neg Hx   . Stomach cancer Neg Hx      Past medical history, social, surgical and family history all reviewed in electronic medical record.  No pertanent information unless stated regarding to the chief complaint.   Review of Systems:Review of systems updated and as accurate as of 11/28/17  No headache, visual changes, nausea, vomiting, diarrhea, constipation, dizziness, abdominal pain, skin rash, fevers, chills, night sweats, weight loss, swollen lymph nodes, body aches, joint swelling, muscle aches, chest pain, shortness of breath, mood changes.   Objective  Blood pressure 104/72, pulse 65, height 5\' 4"  (9.371 m), weight 124 lb (56.2 kg), SpO2 98 %. Systems examined below as of 11/28/17   General: No apparent distress alert and oriented x3 mood and affect normal, dressed appropriately.  HEENT: Pupils equal,  extraocular movements intact  Respiratory: Patient's speak in full sentences and does not appear short of breath  Cardiovascular: No lower extremity edema, non tender, no erythema  Skin: Warm dry intact with no signs of infection or rash on extremities or on axial skeleton.  Abdomen: Soft nontender  Neuro: Cranial nerves II through XII are intact, neurovascularly intact in all extremities with 2+ DTRs and 2+ pulses.  Lymph: No lymphadenopathy of posterior or anterior cervical chain or axillae bilaterally.  Gait normal with good balance and coordination.  MSK:  Non tender with full range of motion and good stability and symmetric strength and tone of shoulders, elbows, wrist, hip, knee and ankles bilaterally.  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  Right thumb shows that patient does have a trigger nodule at the A2 pulley as well as somewhat of the A1 pulley.  Procedure: Real-time Ultrasound Guided Injection of right thumb trigger nodule Device: GE Logiq Q7 Ultrasound guided injection is preferred based studies that show increased duration, increased effect, greater accuracy, decreased procedural pain, increased response rate, and decreased cost with ultrasound guided versus blind injection.  Verbal informed consent obtained.  Time-out conducted.  Noted no overlying erythema, induration, or other signs of local infection.  Skin prepped in a sterile fashion.  Local anesthesia: Topical Ethyl chloride.  With sterile technique and under real time ultrasound guidance: With a 25-gauge half inch needle was injected with 0.5 cc of 0.5% Marcaine and 0.5 cc of Kenalog 40 mg/mL Completed without difficulty  Pain immediately resolved suggesting accurate placement of the medication.  Advised to call if fevers/chills, erythema, induration, drainage, or persistent bleeding.  Images permanently stored and available for review in the ultrasound unit.  Impression: Technically successful ultrasound guided injection.    Impression and Recommendations:     This case required medical decision making of moderate complexity.      Note: This dictation was prepared with Dragon dictation along with smaller phrase technology. Any transcriptional errors that result from this process are unintentional.

## 2017-11-28 ENCOUNTER — Ambulatory Visit: Payer: Self-pay | Admitting: Family Medicine

## 2017-11-28 ENCOUNTER — Ambulatory Visit: Payer: Self-pay

## 2017-11-28 ENCOUNTER — Encounter: Payer: Self-pay | Admitting: Family Medicine

## 2017-11-28 VITALS — BP 104/72 | HR 65 | Ht 64.0 in | Wt 124.0 lb

## 2017-11-28 DIAGNOSIS — M79645 Pain in left finger(s): Secondary | ICD-10-CM | POA: Diagnosis not present

## 2017-11-28 DIAGNOSIS — G8929 Other chronic pain: Secondary | ICD-10-CM | POA: Diagnosis not present

## 2017-11-28 DIAGNOSIS — M65311 Trigger thumb, right thumb: Secondary | ICD-10-CM | POA: Diagnosis not present

## 2017-11-28 NOTE — Assessment & Plan Note (Signed)
Problem.  Injected today.  Given brace at night, icing regimen and topical anti-inflammatories.  Follow-up again in 4 weeks

## 2017-11-28 NOTE — Patient Instructions (Signed)
Good to see you  Trigger thumb Injected again today  Possible bracing at night See me again in 4 weeks just in case.

## 2017-12-07 IMAGING — DX DG CHEST 2V
2 series · 2 of 2 positions shown · non-contrast
Comparison: Chest radiographs 10/21/2016.

CLINICAL DATA: 65-year-old female status post community-acquired
pneumonia now with right lower rib pain. Subsequent encounter.

EXAM:
CHEST  2 VIEW

[chest pa]
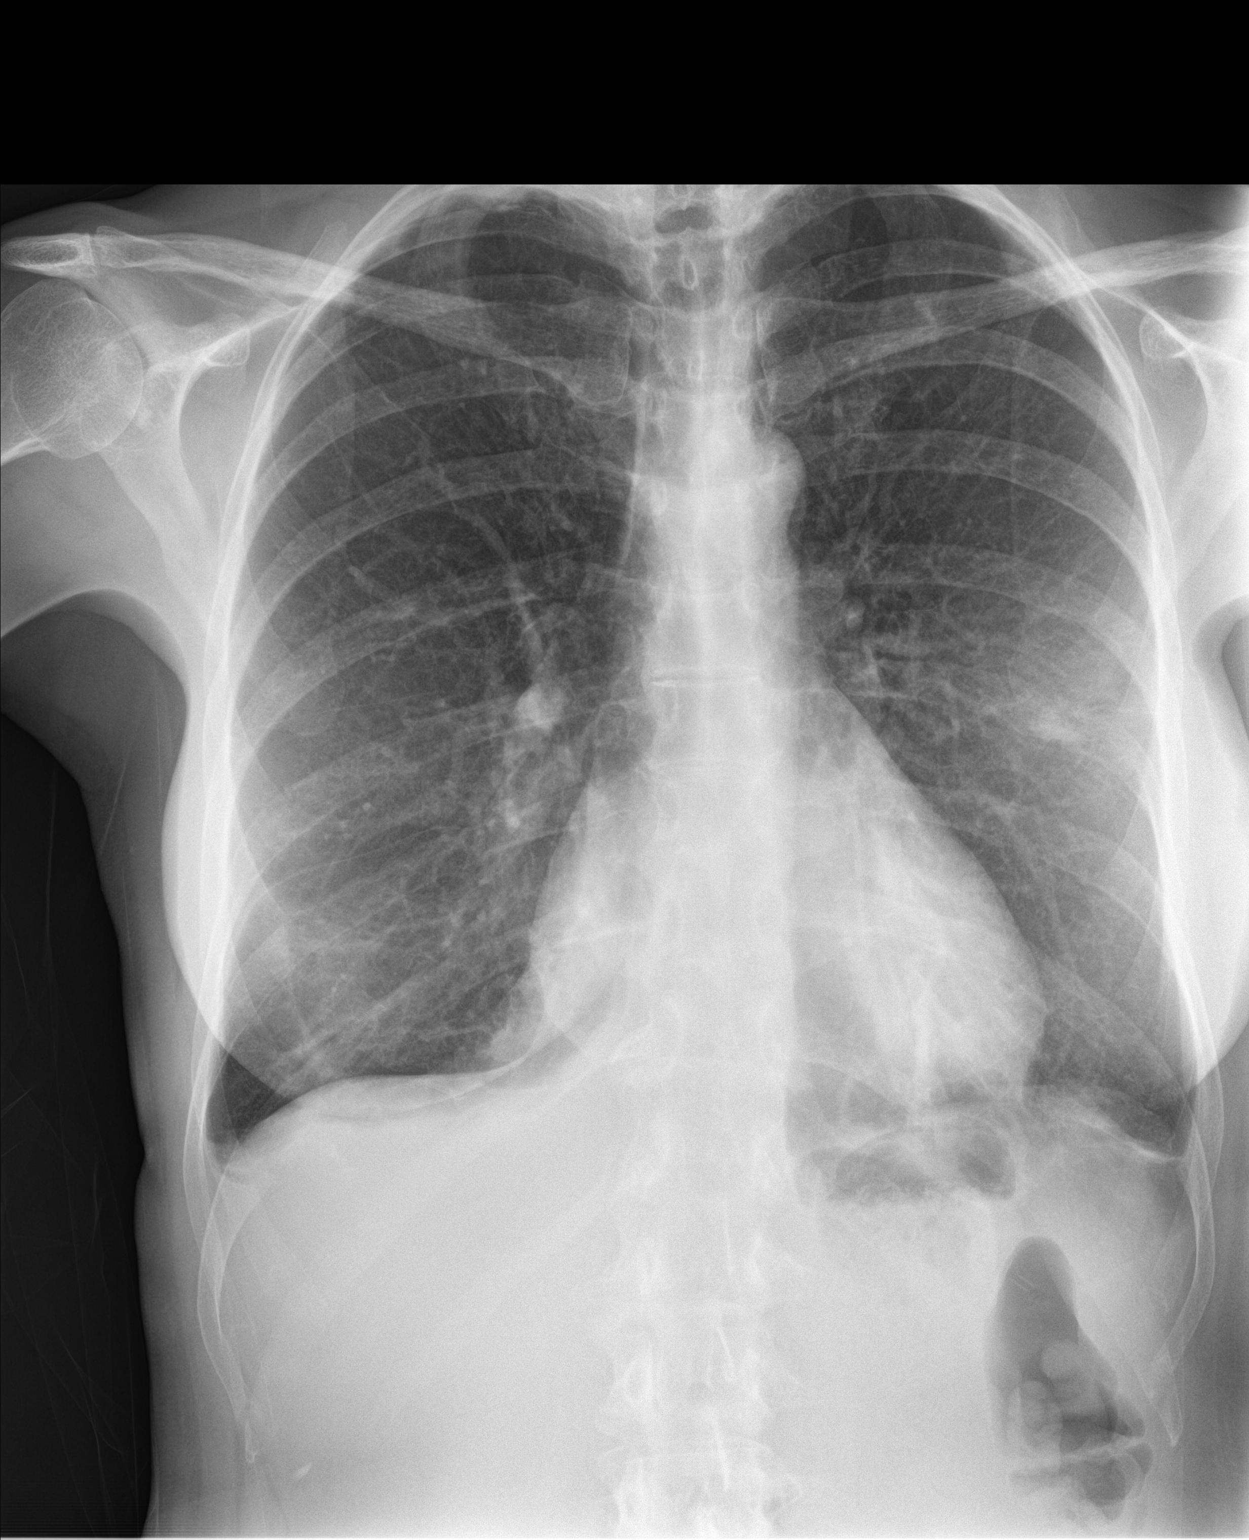

[chest lat]
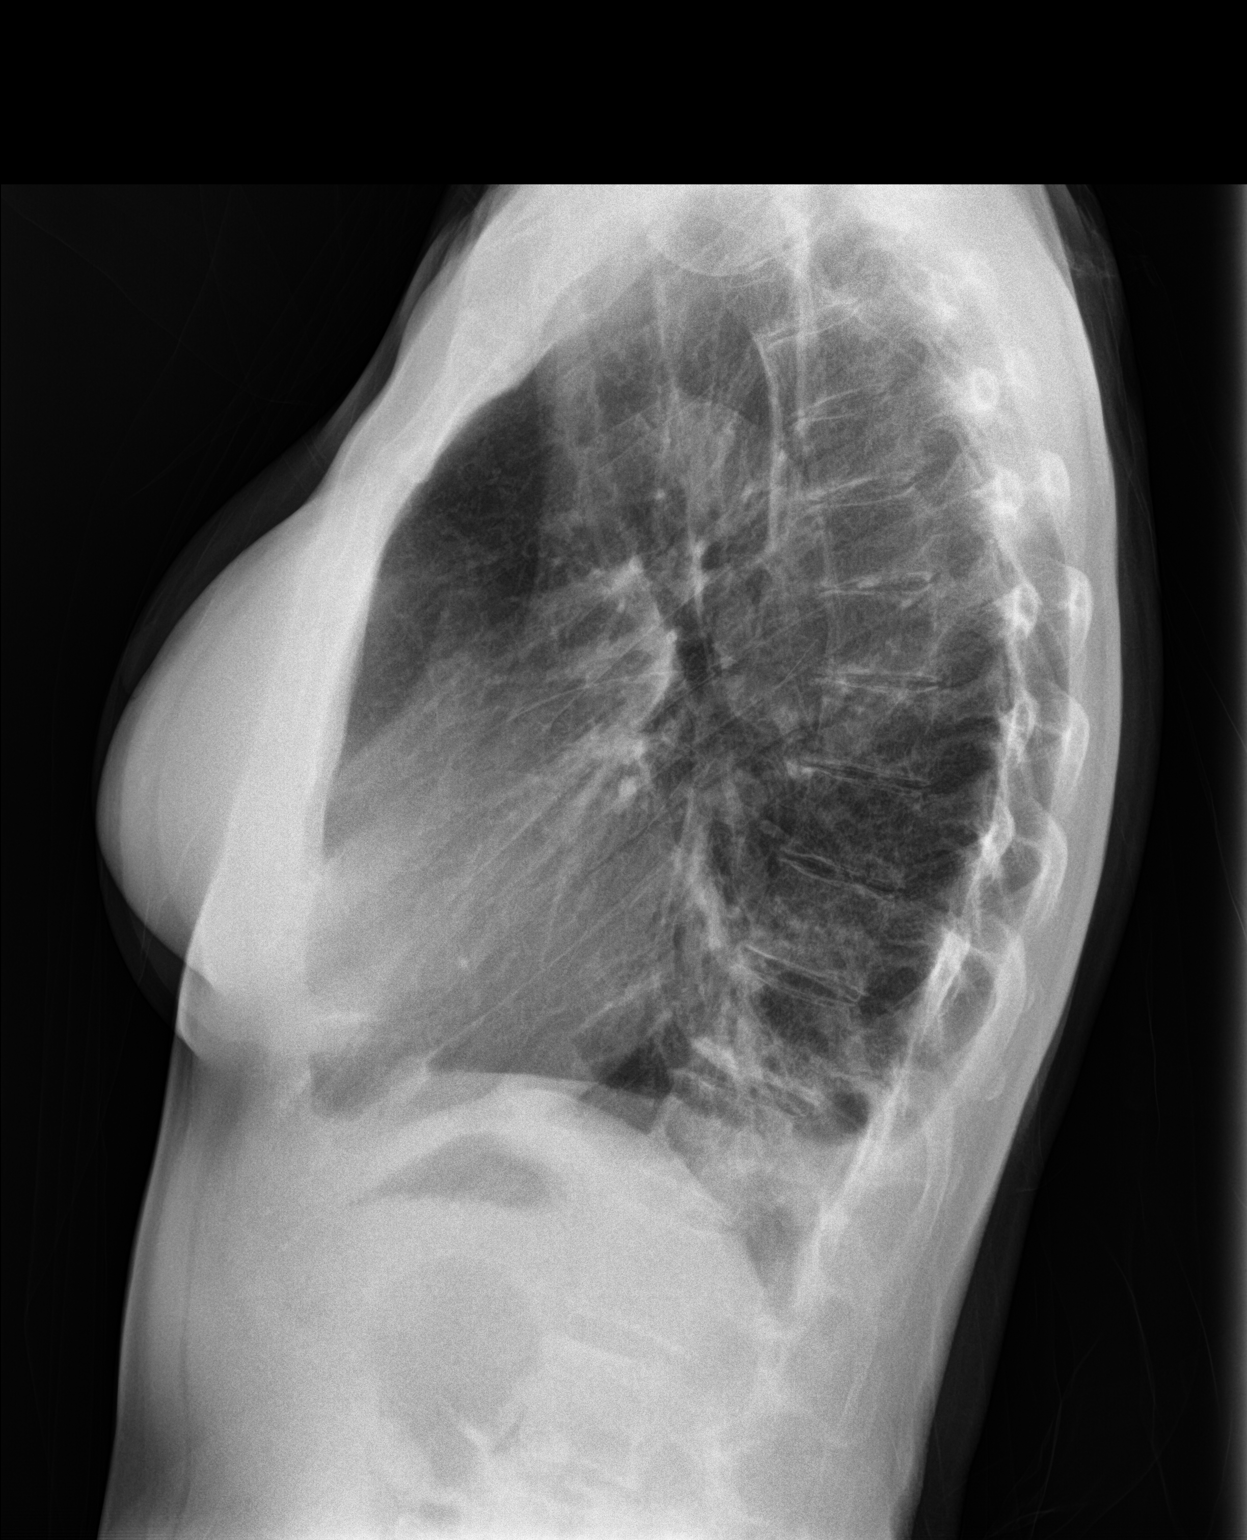

[2 of 2 positions shown; findings below may reference images not displayed]

FINDINGS: Stable lung volumes. Stable cardiac size at the upper limits of
normal to mildly increased while other mediastinal contours remain
normal. Visualized tracheal air column is within normal limits.
Incidental bilateral breast implants and chronic apical lung
scarring.

Regressed but not completely resolved bilateral peribronchial Patchy
and confluent opacity with bilateral lower lobe and left mid lung
involvement. Small right pleural effusion is evident and may be
mildly increased since [REDACTED].

Negative visible bowel gas pattern. No acute osseous abnormality
identified.
IMPRESSION: 1. Regressed but not resolved bilateral bronchopneumonia. No areas
of worsening ventilation. Recommend continued radiographic follow-up
to resolution.
2. Small right pleural effusion appears mildly increased since
[DATE].

## 2017-12-29 ENCOUNTER — Encounter: Payer: Self-pay | Admitting: Family Medicine

## 2017-12-29 ENCOUNTER — Ambulatory Visit: Payer: Medicare Other | Admitting: Family Medicine

## 2017-12-29 DIAGNOSIS — M65311 Trigger thumb, right thumb: Secondary | ICD-10-CM

## 2017-12-29 NOTE — Assessment & Plan Note (Signed)
Doing well after the injection.  Discussed icing regimen and home exercises.  Discussed which activities to do.  As long as patient is welcome follow-up with me again in 6 weeks in case another injection as needed otherwise follow-up as needed

## 2017-12-29 NOTE — Progress Notes (Signed)
[facility] [address] [place], [place] [address] Phone: [phone] Subjective:    I'm seeing this patient by the request  of:    CC: Hand pain follow-up  [id number]  Shari Gonzalez is a 67 y.o. female coming in with complaint of hand pain.  Was seen 1 month ago and did have trigger thumb noted.  Injection given.  Patient states better at this time.  Had some snapping last night but before that was not having any pain.    Past Medical History:  Diagnosis Date  . Allergy    seasonal  . Breast cancer ([place])    left   . Cyst of left knee joint   . Diarrhea   . Heart murmur    Past Surgical History:  Procedure Laterality Date  . ABDOMINAL HYSTERECTOMY     complete with BSO  . AUGMENTATION MAMMAPLASTY Bilateral   . BREAST RECONSTRUCTION WITH PLACEMENT OF TISSUE EXPANDER AND FLEX HD (ACELLULAR HYDRATED DERMIS)    . COLONOSCOPY  2006  . MASTECTOMY Left July 2012    mastectomy   Social History   Socioeconomic History  . Marital status: Married    Spouse name: Not on file  . Number of children: 2  . Years of education: Not on file  . Highest education level: Not on file  Occupational History    Employer: [employer]  . Financial resource strain: Not on file  . Food insecurity:    Worry: Not on file    Inability: Not on file  . Transportation needs:    Medical: Not on file    Non-medical: Not on file  Tobacco Use  . Smoking status: Never Smoker  . Smokeless tobacco: Never Used  Substance and Sexual Activity  . Alcohol use: Yes    Alcohol/week: 1.2 oz    Types: 2 Glasses of wine per week    Comment: Occasional  . Drug use: No  . Sexual activity: Yes    Birth control/protection: None  Lifestyle  . Physical activity:    Days per week: Not on file    Minutes per session: Not on file  . Stress: Not on file  Relationships  . Social connections:    Talks on phone: Not on file    Gets together: Not on  file    Attends religious service: Not on file    Active member of club or organization: Not on file    Attends meetings of clubs or organizations: Not on file    Relationship status: Not on file  Other Topics Concern  . Not on file  Social History Narrative  . Not on file   Allergies  Allergen Reactions  . Dilaudid [Hydromorphone Hcl] Rash  . Guaifenesin Er Rash   Family History  Problem Relation Age of Onset  . Alzheimer's disease Mother   . Kidney cancer Father   . Lung cancer Father   . Gout Father   . Diabetes Paternal Grandmother   . Breast cancer Maternal Aunt   . Liver cancer Paternal Grandfather   . Colon cancer Neg Hx   . Colon polyps Neg Hx   . Esophageal cancer Neg Hx   . Rectal cancer Neg Hx   . Stomach cancer Neg Hx      Past medical history, social, surgical and family history all reviewed in electronic medical record.  No pertanent information unless stated regarding to the chief complaint.  Review of Systems:Review of systems updated and as accurate as of 12/29/17  No headache, visual changes, nausea, vomiting, diarrhea, constipation, dizziness, abdominal pain, skin rash, fevers, chills, night sweats, weight loss, swollen lymph nodes, body aches, joint swelling, chest pain, shortness of breath, mood changes.  Positive muscle aches  Objective  Blood pressure 126/88, pulse 64, height 5\' 4"  (1.626 m), weight 124 lb (56.2 kg), SpO2 91 %. Systems examined below as of 12/29/17   General: No apparent distress alert and oriented x3 mood and affect normal, dressed appropriately.  HEENT: Pupils equal, extraocular movements intact  Respiratory: Patient's speak in full sentences and does not appear short of breath  Cardiovascular: No lower extremity edema, non tender, no erythema  Skin: Warm dry intact with no signs of infection or rash on extremities or on axial skeleton.  Abdomen: Soft nontender  Neuro: Cranial nerves II through XII are intact, neurovascularly  intact in all extremities with 2+ DTRs and 2+ pulses.  Lymph: No lymphadenopathy of posterior or anterior cervical chain or axillae bilaterally.  Gait normal with good balance and coordination.  MSK:  Non tender with full range of motion and good stability and symmetric strength and tone of shoulders, elbows, wrist, hip, knee and ankles bilaterally.  Right hand exam still shows that patient does have a nodule noted over the IP.  No true triggering noted no.  No pain anywhere else.     Impression and Recommendations:     This case required medical decision making of moderate complexity.      Note: This dictation was prepared with Dragon dictation along with smaller phrase technology. Any transcriptional errors that result from this process are unintentional.

## 2017-12-29 NOTE — Patient Instructions (Signed)
Good to see you  I think you will stay about this for sometime.  Heat 10 minutes then massage 10 minutes a night Try shears n the left hand See me again in 6 weeks

## 2018-06-17 NOTE — Progress Notes (Signed)
[facility] [address] [place], [place] [address] Phone: [phone] Subjective:   [name], am serving as a scribe for Dr. Hulan Saas.   CC: Right thumb pain  [id number]  Shari Gonzalez is a 67 y.o. female coming in with complaint of right thumb. Has been triggering for over one month. Did have an injection that helped alleviate her pain in the past. Has improved somewhat since she made appointment. Unable to flex IP joint.      Past Medical History:  Diagnosis Date  . Allergy    seasonal  . Breast cancer ([place])    left   . Cyst of left knee joint   . Diarrhea   . Heart murmur    Past Surgical History:  Procedure Laterality Date  . ABDOMINAL HYSTERECTOMY     complete with BSO  . AUGMENTATION MAMMAPLASTY Bilateral   . BREAST RECONSTRUCTION WITH PLACEMENT OF TISSUE EXPANDER AND FLEX HD (ACELLULAR HYDRATED DERMIS)    . COLONOSCOPY  2006  . MASTECTOMY Left July 2012    mastectomy   Social History   Socioeconomic History  . Marital status: Married    Spouse name: Not on file  . Number of children: 2  . Years of education: Not on file  . Highest education level: Not on file  Occupational History    Employer: [employer]  . Financial resource strain: Not on file  . Food insecurity:    Worry: Not on file    Inability: Not on file  . Transportation needs:    Medical: Not on file    Non-medical: Not on file  Tobacco Use  . Smoking status: Never Smoker  . Smokeless tobacco: Never Used  Substance and Sexual Activity  . Alcohol use: Yes    Alcohol/week: 2.0 standard drinks    Types: 2 Glasses of wine per week    Comment: Occasional  . Drug use: No  . Sexual activity: Yes    Birth control/protection: None  Lifestyle  . Physical activity:    Days per week: Not on file    Minutes per session: Not on file  . Stress: Not on file  Relationships  . Social connections:    Talks on phone: Not on  file    Gets together: Not on file    Attends religious service: Not on file    Active member of club or organization: Not on file    Attends meetings of clubs or organizations: Not on file    Relationship status: Not on file  Other Topics Concern  . Not on file  Social History Narrative  . Not on file   Allergies  Allergen Reactions  . Dilaudid [Hydromorphone Hcl] Rash  . Guaifenesin Er Rash   Family History  Problem Relation Age of Onset  . Alzheimer's disease Mother   . Kidney cancer Father   . Lung cancer Father   . Gout Father   . Diabetes Paternal Grandmother   . Breast cancer Maternal Aunt   . Liver cancer Paternal Grandfather   . Colon cancer Neg Hx   . Colon polyps Neg Hx   . Esophageal cancer Neg Hx   . Rectal cancer Neg Hx   . Stomach cancer Neg Hx       Current Outpatient Medications (Respiratory):  .  chlorpheniramine-HYDROcodone (TUSSIONEX PENNKINETIC ER) 10-8 MG/5ML SUER, Take 5 mLs by mouth every 12 (twelve) hours as needed. Marland Kitchen  loratadine (CLARITIN) 10 MG tablet, Take 10 mg by mouth as needed.  .  promethazine-dextromethorphan (PROMETHAZINE-DM) 6.25-15 MG/5ML syrup, Take 2.5 mLs by mouth 4 (four) times daily as needed for cough. .  triamcinolone (NASACORT AQ) 55 MCG/ACT AERO nasal inhaler, Place 2 sprays into the nose daily.    Current Outpatient Medications (Other):  .  azithromycin (ZITHROMAX) 250 MG tablet, Take 2 tabs PO x 1 dose, then 1 tab PO QD x 4 days .  cholecalciferol (VITAMIN D) 1000 units tablet, Take 2,000 Units by mouth daily. .  Diclofenac Sodium 2 % SOLN, Apply 1 pump twice daily. .  Estradiol 10 MCG TABS vaginal tablet,  .  Multiple Vitamins-Minerals (MULTIVITAMIN WITH MINERALS) tablet, Take 1 tablet by mouth daily.   .  ondansetron (ZOFRAN) 4 MG tablet, Take 1 tablet (4 mg total) by mouth every 8 (eight) hours as needed for nausea or vomiting (and diarrhea). Marland Kitchen  OVER THE COUNTER MEDICATION, daily. aminolase .  Probiotic Product  (PROBIOTIC DAILY PO), Take by mouth.    Past medical history, social, surgical and family history all reviewed in electronic medical record.  No pertanent information unless stated regarding to the chief complaint.   Review of Systems:  No headache, visual changes, nausea, vomiting, diarrhea, constipation, dizziness, abdominal pain, skin rash, fevers, chills, night sweats, weight loss, swollen lymph nodes, body aches, joint swelling, muscle aches, chest pain, shortness of breath, mood changes.   Objective  Blood pressure 118/74, pulse 71, height 5\' 4"  (1.626 m), weight 120 lb (54.4 kg), SpO2 94 %.    General: No apparent distress alert and oriented x3 mood and affect normal, dressed appropriately.  HEENT: Pupils equal, extraocular movements intact  Respiratory: Patient's speak in full sentences and does not appear short of breath  Cardiovascular: No lower extremity edema, non tender, no erythema  Skin: Warm dry intact with no signs of infection or rash on extremities or on axial skeleton.  Abdomen: Soft nontender  Neuro: Cranial nerves II through XII are intact, neurovascularly intact in all extremities with 2+ DTRs and 2+ pulses.  Lymph: No lymphadenopathy of posterior or anterior cervical chain or axillae bilaterally.  Gait normal with good balance and coordination.  MSK:  Non tender with full range of motion and good stability and symmetric strength and tone of shoulders, elbows, wrist, hip, knee and ankles bilaterally.  Mild arthritic changes of multiple joints Right hand shows the patient does have a trigger nodule noted over the IP just proximal to the joint as well as the [facility] joint.  Both tender.  Both triggering.  Procedure: Real-time Ultrasound Guided Injection of right flexor tendon sheath thumb Device: GE Logiq Q7 Ultrasound guided injection is preferred based studies that show increased duration, increased effect, greater accuracy, decreased procedural pain, increased response  rate, and decreased cost with ultrasound guided versus blind injection.  Verbal informed consent obtained.  Time-out conducted.  Noted no overlying erythema, induration, or other signs of local infection.  Skin prepped in a sterile fashion.  Local anesthesia: Topical Ethyl chloride.  With sterile technique and under real time ultrasound guidance: 25-gauge half inch needle injected with 0.5 cc of 0.5% Marcaine and 0.5 cc of Kenalog 40 mg/mL Completed without difficulty  Pain immediately resolved suggesting accurate placement of the medication.  Advised to call if fevers/chills, erythema, induration, drainage, or persistent bleeding.  Images permanently stored and available for review in the ultrasound unit.  Impression: Technically successful ultrasound guided injection.    Impression  and Recommendations:     This case required medical decision making of moderate complexity. The above documentation has been reviewed and is accurate and complete Shari Pulley, DO       Note: This dictation was prepared with Dragon dictation along with smaller phrase technology. Any transcriptional errors that result from this process are unintentional.

## 2018-06-18 ENCOUNTER — Ambulatory Visit: Payer: Medicare Other | Admitting: Family Medicine

## 2018-06-18 ENCOUNTER — Ambulatory Visit: Payer: Self-pay

## 2018-06-18 ENCOUNTER — Encounter: Payer: Self-pay | Admitting: Family Medicine

## 2018-06-18 VITALS — BP 118/74 | HR 71 | Ht 64.0 in | Wt 120.0 lb

## 2018-06-18 DIAGNOSIS — M79644 Pain in right finger(s): Secondary | ICD-10-CM | POA: Diagnosis not present

## 2018-06-18 DIAGNOSIS — G8929 Other chronic pain: Secondary | ICD-10-CM | POA: Diagnosis not present

## 2018-06-18 DIAGNOSIS — M65311 Trigger thumb, right thumb: Secondary | ICD-10-CM | POA: Diagnosis not present

## 2018-06-18 NOTE — Patient Instructions (Signed)
Good to se eyou  Ice is your friend Maybe the brace at night for a good week or so  You know where I am if you need me Call [phone]

## 2018-06-18 NOTE — Assessment & Plan Note (Signed)
Patient given injection.  Tolerated procedure well.  Discussed icing regimen and home exercise.  Discussed which activities to do which wants to avoid.  Discussed bracing at night.  Follow-up again in 4 weeks

## 2018-07-09 ENCOUNTER — Telehealth: Payer: Self-pay | Admitting: Family Medicine

## 2018-07-09 DIAGNOSIS — Z0000 Encounter for general adult medical examination without abnormal findings: Secondary | ICD-10-CM

## 2018-07-09 NOTE — Telephone Encounter (Signed)
-----   Message from Eustace Pen, LPN sent at 20/91/0681  2:35 PM EDT ----- Regarding: Labs 10/16 Lab orders needed. Thank you.  Insurance:  [facility] Medicare

## 2018-07-11 ENCOUNTER — Ambulatory Visit: Payer: Medicare Other

## 2018-08-08 ENCOUNTER — Ambulatory Visit (INDEPENDENT_AMBULATORY_CARE_PROVIDER_SITE_OTHER): Payer: Medicare Other | Admitting: Family Medicine

## 2018-08-08 ENCOUNTER — Encounter: Payer: Self-pay | Admitting: Family Medicine

## 2018-08-08 VITALS — BP 110/64 | HR 56 | Temp 96.7°F | Ht 63.25 in | Wt 122.0 lb

## 2018-08-08 DIAGNOSIS — Z1159 Encounter for screening for other viral diseases: Secondary | ICD-10-CM | POA: Diagnosis not present

## 2018-08-08 DIAGNOSIS — Z114 Encounter for screening for human immunodeficiency virus [HIV]: Secondary | ICD-10-CM | POA: Insufficient documentation

## 2018-08-08 DIAGNOSIS — Z170 Estrogen receptor positive status [ER+]: Secondary | ICD-10-CM

## 2018-08-08 DIAGNOSIS — Z23 Encounter for immunization: Secondary | ICD-10-CM | POA: Diagnosis not present

## 2018-08-08 DIAGNOSIS — C50912 Malignant neoplasm of unspecified site of left female breast: Secondary | ICD-10-CM | POA: Diagnosis not present

## 2018-08-08 DIAGNOSIS — Z0000 Encounter for general adult medical examination without abnormal findings: Secondary | ICD-10-CM | POA: Diagnosis not present

## 2018-08-08 NOTE — Progress Notes (Signed)
Subjective:    Patient ID: Shari Gonzalez, female    DOB: May 12, 1951, 67 y.o.   MRN: [id number]  HPI Here for welcome to medicare preventative visit  I have personally reviewed the Medicare Annual Wellness questionnaire and have noted 1. The patient's medical and social history 2. Their use of alcohol, tobacco or illicit drugs 3. Their current medications and supplements 4. The patient's functional ability including ADL's, fall risks, home safety risks and hearing or visual             impairment. 5. Diet and physical activities 6. Evidence for depression or mood disorders  The patients weight, height, BMI have been recorded in the chart and visual acuity is per eye clinic.  I have made referrals, counseling and provided education to the patient based review of the above and I have provided the pt with a written personalized care plan for preventive services. Reviewed and updated provider list, see scanned forms.  See scanned forms.  Routine anticipatory guidance given to patient.  See health maintenance. Hepatitis C screening-will do today  HIV screening -will do today  Colon cancer screening  Colonoscopy 2/17 neg with 10 y recall  Breast cancer screening  Mammogram 8/19 (R with implants) -h/o breast cancer Had L mastectomy and declined tamoxifen  Self breast exam-no changes  Pap 6/16 neg (hysterectomy)-gyn -had a pap test at that time  Flu vaccine-due for  Tetanus vaccine - ? When  Pneumovax-wants to start  Zoster vaccine- interested in shingrix (had shingles in the past)  dexa -had one at Dr Verlon Au office (no bone loss)  No falls or fractures  Takes vit D and calcium  Also regular exercise  Advance directive- has living will and poa (poa is her husband)  Cognitive function addressed- see scanned forms- and if abnormal then additional documentation follows. Wants to stay on top of- mother had alz   PMH and SH reviewed  Meds, vitals, and allergies reviewed.   ROS:  See HPI.  Otherwise negative.    Wt Readings from Last 3 Encounters:  08/08/18 122 lb (55.3 kg)  06/18/18 120 lb (54.4 kg)  12/29/17 124 lb (56.2 kg)  stays social and goes to the gym  Eats healthy  21.44 kg/m    Hearing Screening   125Hz  250Hz  500Hz  1000Hz  2000Hz  3000Hz  4000Hz  6000Hz  8000Hz   Right ear:   40 40 40  40    Left ear:   40 40 40  40      Visual Acuity Screening   Right eye Left eye Both eyes  Without correction: 20/20 20/40 20/20   With correction:       Due for labs  BP Readings from Last 3 Encounters:  08/08/18 110/64  06/18/18 118/74  12/29/17 126/88   Pulse Readings from Last 3 Encounters:  08/08/18 (!) 56  06/18/18 71  12/29/17 64    Sees derm regularly  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 Welcome to Medicare preventive visit 08/08/2018  . Encounter for screening for HIV 08/08/2018  . Encounter for hepatitis C screening test for low risk patient 08/08/2018  . Routine general medical examination at a health care facility 07/09/2018  . Trigger thumb, right thumb 11/28/2017  . Allergic rhinitis 08/23/2016  . Patellar tendinitis of left knee 03/08/2016  . Trigger thumb of left hand 12/01/2015  . Perimeniscal cyst of left knee 11/10/2015  .  Breast cancer, left breast ([place]) 05/29/2014  . Bloating 04/08/2013   Past Medical History:  Diagnosis Date  . Allergy    seasonal  . Breast cancer ([place])    left   . Cyst of left knee joint   . Diarrhea   . Heart murmur    Past Surgical History:  Procedure Laterality Date  . ABDOMINAL HYSTERECTOMY     complete with BSO  . AUGMENTATION MAMMAPLASTY Bilateral   . BREAST RECONSTRUCTION WITH PLACEMENT OF TISSUE EXPANDER AND FLEX HD (ACELLULAR HYDRATED DERMIS)    . COLONOSCOPY  2006  . MASTECTOMY Left July 2012    mastectomy   Social History   Tobacco Use  . Smoking status: Never Smoker  . Smokeless tobacco: Never Used  Substance Use  Topics  . Alcohol use: Yes    Alcohol/week: 2.0 standard drinks    Types: 2 Glasses of wine per week    Comment: Occasional  . Drug use: No   Family History  Problem Relation Age of Onset  . Alzheimer's disease Mother   . Kidney cancer Father   . Lung cancer Father   . Gout Father   . Diabetes Paternal Grandmother   . Breast cancer Maternal Aunt   . Liver cancer Paternal Grandfather   . Colon cancer Neg Hx   . Colon polyps Neg Hx   . Esophageal cancer Neg Hx   . Rectal cancer Neg Hx   . Stomach cancer Neg Hx    Allergies  Allergen Reactions  . Dilaudid [Hydromorphone Hcl] Rash  . Guaifenesin Er Rash   Current Outpatient Medications on File Prior to Visit  Medication Sig Dispense Refill  . cholecalciferol (VITAMIN D) 1000 units tablet Take 2,000 Units by mouth daily.    . Estradiol 10 MCG TABS vaginal tablet     . loratadine (CLARITIN) 10 MG tablet Take 10 mg by mouth as needed.     . Magnesium Chloride (MAGNESIUM DR PO) Take 1 tablet by mouth daily.    . Multiple Vitamins-Minerals (MULTIVITAMIN WITH MINERALS) tablet Take 1 tablet by mouth daily.      . Probiotic Product (PROBIOTIC DAILY PO) Take by mouth.    . Pyridoxine HCl (VITAMIN B6 PO) Take 1 capsule by mouth daily.    . TURMERIC PO Take by mouth.     No current facility-administered medications on file prior to visit.      Review of Systems  Constitutional: Negative for activity change, appetite change, fatigue, fever and unexpected weight change.  HENT: Negative for congestion, ear pain, rhinorrhea, sinus pressure and sore throat.   Eyes: Negative for pain, redness and visual disturbance.  Respiratory: Negative for cough, shortness of breath and wheezing.   Cardiovascular: Negative for chest pain and palpitations.  Gastrointestinal: Negative for abdominal pain, blood in stool, constipation and diarrhea.  Endocrine: Negative for polydipsia and polyuria.  Genitourinary: Negative for dysuria, frequency and  urgency.  Musculoskeletal: Negative for arthralgias, back pain and myalgias.  Skin: Negative for pallor and rash.  Allergic/Immunologic: Negative for environmental allergies.  Neurological: Negative for dizziness, syncope and headaches.  Hematological: Negative for adenopathy. Does not bruise/bleed easily.  Psychiatric/Behavioral: Negative for decreased concentration and dysphoric mood. The patient is not nervous/anxious.        Objective:   Physical Exam  Constitutional: She appears well-developed and well-nourished. No distress.  Slim and well app  HENT:  Head: Normocephalic and atraumatic.  Right Ear: External ear normal.  Left Ear: External ear  normal.  Nose: Nose normal.  Mouth/Throat: Oropharynx is clear and moist.  Eyes: Pupils are equal, round, and reactive to light. Conjunctivae and EOM are normal. Right eye exhibits no discharge. Left eye exhibits no discharge. No scleral icterus.  Neck: Normal range of motion. Neck supple. No JVD present. Carotid bruit is not present. No thyromegaly present.  Cardiovascular: Normal rate, regular rhythm, normal heart sounds and intact distal pulses. Exam reveals no gallop.  Pulmonary/Chest: Effort normal and breath sounds normal. No stridor. No respiratory distress. She has no wheezes. She has no rales.  Abdominal: Soft. Bowel sounds are normal. She exhibits no distension and no mass. There is no tenderness.  Genitourinary:  Genitourinary Comments: Breast implants  Exam done by gyn  Musculoskeletal: She exhibits no edema, tenderness or deformity.  No kyphosis  Lymphadenopathy:    She has no cervical adenopathy.  Neurological: She is alert. She displays normal reflexes. No cranial nerve deficit. She exhibits normal muscle tone. Coordination normal.  Skin: Skin is warm and dry. No rash noted. No erythema. No pallor.  Solar lentigines diffusely    Psychiatric: She has a normal mood and affect.  Pleasant           Assessment & Plan:     Problem List Items Addressed This Visit      Other   Breast cancer, left breast ([place])    S/p mastectomy and double breast iplants  Declined tamoxifen Doing well  Mammogram rev from 8/19  Continue oncology f/u         Encounter for hepatitis C screening test for low risk patient    Hep C screening today       Relevant Orders   Hepatitis C antibody   Encounter for screening for HIV    HIV screening today      Relevant Orders   HIV Antibody (routine testing w rflx)   Welcome to Medicare preventive visit - Primary    Reviewed health habits including diet and exercise and skin cancer prevention Reviewed appropriate screening tests for age  Also reviewed health mt list, fam hx and immunization status , as well as social and family history   See HPI Labs ordered  Flu vaccine and prevnar vaccine today  Sent for last pap and dexa report from gyn provider Rev adv directive No cognitive concerns  Enc to keep up exercise and good care and ca/ D Continue dermatology f/u       Relevant Orders   CBC with Differential/Platelet   Comprehensive metabolic panel   Lipid panel   TSH   Flu Vaccine QUAD 6+ mos PF IM (Fluarix Quad PF) (Completed)   Pneumococcal conjugate vaccine 13-valent (Completed)    Other Visit Diagnoses    Need for influenza vaccination       Relevant Orders   Flu Vaccine QUAD 6+ mos PF IM (Fluarix Quad PF) (Completed)   Need for vaccination with 13-polyvalent pneumococcal conjugate vaccine       Relevant Orders   Pneumococcal conjugate vaccine 13-valent (Completed)

## 2018-08-08 NOTE — Patient Instructions (Addendum)
If you are interested in the new shingles vaccine (Shingrix) - call your local pharmacy to check on coverage and availability  If affordable, get on a wait list at your pharmacy to get the vaccine.  Flu shot today  Pneumonia shot today  Labs today   Take care of yourself Keep up with dermatology visits and sunscreen

## 2018-08-08 NOTE — Assessment & Plan Note (Signed)
Reviewed health habits including diet and exercise and skin cancer prevention Reviewed appropriate screening tests for age  Also reviewed health mt list, fam hx and immunization status , as well as social and family history   See HPI Labs ordered  Flu vaccine and prevnar vaccine today  Sent for last pap and dexa report from gyn provider Rev adv directive No cognitive concerns  Enc to keep up exercise and good care and ca/ D Continue dermatology f/u

## 2018-08-08 NOTE — Assessment & Plan Note (Addendum)
S/p mastectomy and double breast iplants  Declined tamoxifen Doing well  Mammogram rev from 8/19  Continue oncology f/u

## 2018-08-08 NOTE — Assessment & Plan Note (Signed)
HIV screening today 

## 2018-08-08 NOTE — Assessment & Plan Note (Signed)
Hep C screening today 

## 2018-08-09 LAB — COMPREHENSIVE METABOLIC PANEL
ALT: 17 U/L (ref 0–35)
AST: 24 U/L (ref 0–37)
Albumin: 4.7 g/dL (ref 3.5–5.2)
Alkaline Phosphatase: 65 U/L (ref 39–117)
BILIRUBIN TOTAL: 0.7 mg/dL (ref 0.2–1.2)
BUN: 15 mg/dL (ref 6–23)
CHLORIDE: 104 meq/L (ref 96–112)
CO2: 24 meq/L (ref 19–32)
CREATININE: 0.8 mg/dL (ref 0.40–1.20)
Calcium: 9.8 mg/dL (ref 8.4–10.5)
GFR: 76.3 mL/min (ref >60.00)
GLUCOSE: 66 mg/dL — AB (ref 70–99)
Potassium: 3.8 mEq/L (ref 3.5–5.1)
SODIUM: 141 meq/L (ref 135–145)
Total Protein: 7.2 g/dL (ref 6.0–8.3)

## 2018-08-09 LAB — CBC WITH DIFFERENTIAL/PLATELET
BASOS ABS: 0.1 10*3/uL (ref 0.0–0.1)
BASOS PCT: 1 % (ref 0.0–3.0)
EOS ABS: 0.1 10*3/uL (ref 0.0–0.7)
Eosinophils Relative: 1.7 % (ref 0.0–5.0)
HCT: 40.2 % (ref 36.0–46.0)
Hemoglobin: 13.7 g/dL (ref 12.0–15.0)
LYMPHS ABS: 1.4 10*3/uL (ref 0.7–4.0)
LYMPHS PCT: 29.4 % (ref 12.0–46.0)
MCHC: 34.2 g/dL (ref 30.0–36.0)
MCV: 92 fl (ref 78.0–100.0)
MONO ABS: 0.3 10*3/uL (ref 0.1–1.0)
Monocytes Relative: 5.8 % (ref 3.0–12.0)
NEUTROS ABS: 3 10*3/uL (ref 1.4–7.7)
NEUTROS PCT: 62.1 % (ref 43.0–77.0)
PLATELETS: 181 10*3/uL (ref 150.0–400.0)
RBC: 4.37 Mil/uL (ref 3.87–5.11)
RDW: 13.5 % (ref 11.5–15.5)
WBC: 4.9 10*3/uL (ref 4.0–10.5)

## 2018-08-09 LAB — LIPID PANEL
CHOL/HDL RATIO: 2
Cholesterol: 157 mg/dL (ref 0–200)
HDL: 63 mg/dL (ref >39.00)
LDL CALC: 81 mg/dL (ref 0–99)
NONHDL: 94.39
Triglycerides: 69 mg/dL (ref 0.0–149.0)
VLDL: 13.8 mg/dL (ref 0.0–40.0)

## 2018-08-09 LAB — TSH: TSH: 3.62 u[IU]/mL (ref 0.35–4.50)

## 2018-08-09 LAB — HIV ANTIBODY (ROUTINE TESTING W REFLEX): HIV: NONREACTIVE

## 2018-08-09 LAB — HEPATITIS C ANTIBODY
HEP C AB: NONREACTIVE
SIGNAL TO CUT-OFF: 0.01 (ref <1.00)

## 2018-08-10 ENCOUNTER — Encounter: Payer: Self-pay | Admitting: *Deleted

## 2019-05-12 ENCOUNTER — Other Ambulatory Visit: Payer: Self-pay

## 2019-05-12 ENCOUNTER — Emergency Department
Admission: EM | Admit: 2019-05-12 | Discharge: 2019-05-12 | Discharge status: Against Medical Advice | Payer: Medicare Other

## 2019-05-16 ENCOUNTER — Telehealth: Payer: Self-pay | Admitting: Family Medicine

## 2019-05-16 NOTE — Telephone Encounter (Signed)
I would wait until after being re tested to watch grandkids

## 2019-05-16 NOTE — Telephone Encounter (Signed)
I assume that meant 05/13/19 If she wants to re test- I would do it late next week since she had a neg test so recently  She can call when ready and I will order it  [facility] her husband is doing ok

## 2019-05-16 NOTE — Telephone Encounter (Signed)
Before I call pt, please see last question pt had about watching her grandchildren, thanks

## 2019-05-16 NOTE — Telephone Encounter (Signed)
Patient's husband tested positive for Covid on 06/13/19.  Patient was tested at the same time as her husband and it came back negative.  Patient had a slight headache yesterday and it went away today, but she doesn't have any other symptoms.  Patient wants to know if she needs to be tested, and if so, how long until she gets tested again.  Patient has grandchildren she takes watches and patient wants to know when she can start watching the grandchildren again.

## 2019-05-16 NOTE — Telephone Encounter (Signed)
Pt notified of all of Dr. Marliss Coots comments and instructions and verbalized understanding. She wants to get tested again on Wednesday she will call us to do the order then

## 2019-06-18 ENCOUNTER — Telehealth: Payer: Self-pay | Admitting: *Deleted

## 2019-06-18 NOTE — Telephone Encounter (Signed)
Sore throat can be a symptom of covid -so watch for fever/loss of taste/smell/cough/sob/GI symptoms  If not improved with her allergy medication then please schedule a virtual visit

## 2019-06-18 NOTE — Telephone Encounter (Signed)
Patient called back. Advised of message below.  She scheduled a virtual visit for tomorrow. Patient stated that she now has a fever and felt like it was better to go ahead and schedule appt

## 2019-06-18 NOTE — Telephone Encounter (Signed)
Patient left a voicemail stating that she started with a sore throat yesterday and has had chills, but no fever. Patient stated that she has allergies and has been off of allergy medication for a while. Patient stated that she has now started back on her allergy pills. Patient wants to know if she should be concerned about covid?

## 2019-06-18 NOTE — Telephone Encounter (Signed)
I will see her then  

## 2019-06-19 ENCOUNTER — Other Ambulatory Visit: Payer: Self-pay

## 2019-06-19 ENCOUNTER — Encounter: Payer: Self-pay | Admitting: Family Medicine

## 2019-06-19 ENCOUNTER — Other Ambulatory Visit: Payer: Self-pay | Admitting: *Deleted

## 2019-06-19 ENCOUNTER — Ambulatory Visit (INDEPENDENT_AMBULATORY_CARE_PROVIDER_SITE_OTHER): Payer: Medicare Other | Admitting: Family Medicine

## 2019-06-19 DIAGNOSIS — Z20822 Contact with and (suspected) exposure to covid-19: Secondary | ICD-10-CM

## 2019-06-19 DIAGNOSIS — C50912 Malignant neoplasm of unspecified site of left female breast: Secondary | ICD-10-CM

## 2019-06-19 DIAGNOSIS — J069 Acute upper respiratory infection, unspecified: Secondary | ICD-10-CM | POA: Diagnosis not present

## 2019-06-19 DIAGNOSIS — Z170 Estrogen receptor positive status [ER+]: Secondary | ICD-10-CM | POA: Diagnosis not present

## 2019-06-19 NOTE — Patient Instructions (Signed)
Go get a covid test now - let us know if you have any problems  Isolate yourself until result returns and you feel better  Continue acetaminophen and fluids and rest  Salt water gargles can help sore throat if it returns Please watch for increased temp or cough/shortness of breath or other new symptoms

## 2019-06-19 NOTE — Assessment & Plan Note (Signed)
Sore throat and chills followed by congestion and low grade temp  No sick exposures known  Sent for covid testing today-will isolate herself until that returns and symptoms are better Symptomatic care discussed, fluids/rest/acetaminophen if needed  Saline spray prn may help congestion  Update if not starting to improve in a week or if worsening  -esp if any cough/sob or other new symptoms

## 2019-06-19 NOTE — Progress Notes (Signed)
Virtual Visit via Video Note  I connected with Shari Gonzalez on 06/19/19 at  8:00 AM EDT by a video enabled telemedicine application and verified that I am speaking with the correct person using two identifiers.  Location: Patient: home Provider: office    I discussed the limitations of evaluation and management by telemedicine and the availability of in person appointments. The patient expressed understanding and agreed to proceed.  History of Present Illness: Pt presents with sore throat   Started Monday with terrible sore throat in the afternoon  Terrible chills (temp was not up) Yesterday temp went up - 100.4 This am 99.6 This am throat is not hurting  Some nasal congestion  No sob  No coughing  No loss of taste or smell   Runny nose and sneezing   No GI symptoms    otc: taking ES acetaminophen  Husband had covid a month ago -she never got it  She was tested twice   Retired but keeps grandchildren  She limits her time out   Patient Active Problem List   Diagnosis Date Noted  . Viral URI 06/19/2019  . Welcome to Medicare preventive visit 08/08/2018  . Encounter for screening for HIV 08/08/2018  . Encounter for hepatitis C screening test for low risk patient 08/08/2018  . Routine general medical examination at a health care facility 07/09/2018  . Trigger thumb, right thumb 11/28/2017  . Allergic rhinitis 08/23/2016  . Patellar tendinitis of left knee 03/08/2016  . Trigger thumb of left hand 12/01/2015  . Perimeniscal cyst of left knee 11/10/2015  . Breast cancer, left breast ([place]) 05/29/2014  . Bloating 04/08/2013   Past Medical History:  Diagnosis Date  . Allergy    seasonal  . Breast cancer ([place])    left   . Cyst of left knee joint   . Diarrhea   . Heart murmur    Past Surgical History:  Procedure Laterality Date  . ABDOMINAL HYSTERECTOMY     complete with BSO  . AUGMENTATION MAMMAPLASTY Bilateral   . BREAST RECONSTRUCTION WITH PLACEMENT OF  TISSUE EXPANDER AND FLEX HD (ACELLULAR HYDRATED DERMIS)    . COLONOSCOPY  2006  . MASTECTOMY Left July 2012    mastectomy   Social History   Tobacco Use  . Smoking status: Never Smoker  . Smokeless tobacco: Never Used  Substance Use Topics  . Alcohol use: Yes    Alcohol/week: 2.0 standard drinks    Types: 2 Glasses of wine per week    Comment: Occasional  . Drug use: No   Family History  Problem Relation Age of Onset  . Alzheimer's disease Mother   . Kidney cancer Father   . Lung cancer Father   . Gout Father   . Diabetes Paternal Grandmother   . Breast cancer Maternal Aunt   . Liver cancer Paternal Grandfather   . Colon cancer Neg Hx   . Colon polyps Neg Hx   . Esophageal cancer Neg Hx   . Rectal cancer Neg Hx   . Stomach cancer Neg Hx    Allergies  Allergen Reactions  . Dilaudid [Hydromorphone Hcl] Rash  . Guaifenesin Er Rash   Current Outpatient Medications on File Prior to Visit  Medication Sig Dispense Refill  . cholecalciferol (VITAMIN D) 1000 units tablet Take 2,000 Units by mouth daily.    . Estradiol 10 MCG TABS vaginal tablet     . loratadine (CLARITIN) 10 MG tablet Take 10 mg by mouth as  needed.     . Magnesium Chloride (MAGNESIUM DR PO) Take 1 tablet by mouth daily.    . Multiple Vitamins-Minerals (MULTIVITAMIN WITH MINERALS) tablet Take 1 tablet by mouth daily.      . Probiotic Product (PROBIOTIC DAILY PO) Take by mouth.    . Pyridoxine HCl (VITAMIN B6 PO) Take 1 capsule by mouth daily.    . TURMERIC PO Take by mouth.     No current facility-administered medications on file prior to visit.      Observations/Objective: Patient appears well, in no distress Weight is baseline  No facial swelling or asymmetry Normal voice-not hoarse and no slurred speech Does sound congested (nasal)  No obvious tremor or mobility impairment Moving neck and UEs normally Able to hear the call well  No cough or shortness of breath during interview  Talkative and  mentally sharp with no cognitive changes No skin changes on face or neck , no rash or pallor Affect is normal    Assessment and Plan: Problem List Items Addressed This Visit      Respiratory   Viral URI    Sore throat and chills followed by congestion and low grade temp  No sick exposures known  Sent for covid testing today-will isolate herself until that returns and symptoms are better Symptomatic care discussed, fluids/rest/acetaminophen if needed  Saline spray prn may help congestion  Update if not starting to improve in a week or if worsening  -esp if any cough/sob or other new symptoms           Follow Up Instructions: Go get a covid test now - let us know if you have any problems  Isolate yourself until result returns and you feel better  Continue acetaminophen and fluids and rest  Salt water gargles can help sore throat if it returns Please watch for increased temp or cough/shortness of breath or other new symptoms    I discussed the assessment and treatment plan with the patient. The patient was provided an opportunity to ask questions and all were answered. The patient agreed with the plan and demonstrated an understanding of the instructions.   The patient was advised to call back or seek an in-person evaluation if the symptoms worsen or if the condition fails to improve as anticipated.     Loura Pardon, MD

## 2019-06-19 NOTE — Assessment & Plan Note (Signed)
Continues oncology f/u

## 2019-06-21 LAB — NOVEL CORONAVIRUS, NAA: SARS-CoV-2, NAA: NOT DETECTED

## 2019-06-26 ENCOUNTER — Telehealth: Payer: Self-pay | Admitting: *Deleted

## 2019-06-26 NOTE — Telephone Encounter (Signed)
Patient called stating that she still has a non-productive cough, head and chest congestion and low grade fever.  Patient stated that she does not have SOB or difficulty breathing. Patient stated that her covid test was negative. Patient wants to know what Dr. Glori Bickers recommends over the counter? Pharmacy [place]

## 2019-06-26 NOTE — Telephone Encounter (Signed)
Pt notified of Dr. Tower's comments  

## 2019-06-26 NOTE — Telephone Encounter (Signed)
Patient left a voicemail requesting a call back today.

## 2019-06-26 NOTE — Telephone Encounter (Signed)
Delsym for cough (dexamethoraphan) for cough  I usually recommend mucinex but think she is allergic to it  Nasal saline spray  Acetaminophen for headache/pain/fever  Antihistamine of choice (zyrtec/allegra/claritin) for runny nose or drip   Keep me posted  Continue to rest and drink fluids

## 2019-07-02 LAB — HM DEXA SCAN: HM Dexa Scan: NORMAL

## 2019-08-05 ENCOUNTER — Telehealth: Payer: Self-pay | Admitting: Family Medicine

## 2019-08-05 DIAGNOSIS — Z0000 Encounter for general adult medical examination without abnormal findings: Secondary | ICD-10-CM

## 2019-08-05 NOTE — Telephone Encounter (Signed)
-----   Message from Ellamae Sia sent at 07/31/2019 12:51 PM EST ----- Regarding: lab orders for Tuesday, 11.10.20 Patient is scheduled for CPX labs, please order future labs, Thanks , Karna Christmas

## 2019-08-06 ENCOUNTER — Other Ambulatory Visit (INDEPENDENT_AMBULATORY_CARE_PROVIDER_SITE_OTHER): Payer: Medicare Other

## 2019-08-06 DIAGNOSIS — Z0000 Encounter for general adult medical examination without abnormal findings: Secondary | ICD-10-CM | POA: Diagnosis not present

## 2019-08-06 LAB — CBC WITH DIFFERENTIAL/PLATELET
Basophils Absolute: 0 10*3/uL (ref 0.0–0.1)
Basophils Relative: 0.8 % (ref 0.0–3.0)
Eosinophils Absolute: 0.1 10*3/uL (ref 0.0–0.7)
Eosinophils Relative: 1.2 % (ref 0.0–5.0)
HCT: 39.8 % (ref 36.0–46.0)
Hemoglobin: 13.5 g/dL (ref 12.0–15.0)
Lymphocytes Relative: 17.7 % (ref 12.0–46.0)
Lymphs Abs: 1 10*3/uL (ref 0.7–4.0)
MCHC: 34 g/dL (ref 30.0–36.0)
MCV: 91.2 fl (ref 78.0–100.0)
Monocytes Absolute: 0.3 10*3/uL (ref 0.1–1.0)
Monocytes Relative: 6.1 % (ref 3.0–12.0)
Neutro Abs: 4.2 10*3/uL (ref 1.4–7.7)
Neutrophils Relative %: 74.2 % (ref 43.0–77.0)
Platelets: 188 10*3/uL (ref 150.0–400.0)
RBC: 4.36 Mil/uL (ref 3.87–5.11)
RDW: 13.7 % (ref 11.5–15.5)
WBC: 5.7 10*3/uL (ref 4.0–10.5)

## 2019-08-06 LAB — COMPREHENSIVE METABOLIC PANEL
ALT: 13 U/L (ref 0–35)
AST: 18 U/L (ref 0–37)
Albumin: 4.5 g/dL (ref 3.5–5.2)
Alkaline Phosphatase: 87 U/L (ref 39–117)
BUN: 15 mg/dL (ref 6–23)
CO2: 29 mEq/L (ref 19–32)
Calcium: 9.4 mg/dL (ref 8.4–10.5)
Chloride: 104 mEq/L (ref 96–112)
Creatinine, Ser: 0.7 mg/dL (ref 0.40–1.20)
GFR: 83.5 mL/min (ref >60.00)
Glucose, Bld: 90 mg/dL (ref 70–99)
Potassium: 3.6 mEq/L (ref 3.5–5.1)
Sodium: 141 mEq/L (ref 135–145)
Total Bilirubin: 0.7 mg/dL (ref 0.2–1.2)
Total Protein: 6.6 g/dL (ref 6.0–8.3)

## 2019-08-06 LAB — LIPID PANEL
Cholesterol: 170 mg/dL (ref 0–200)
HDL: 58.8 mg/dL (ref >39.00)
LDL Cholesterol: 99 mg/dL (ref 0–99)
NonHDL: 111.38
Total CHOL/HDL Ratio: 3
Triglycerides: 64 mg/dL (ref 0.0–149.0)
VLDL: 12.8 mg/dL (ref 0.0–40.0)

## 2019-08-06 LAB — TSH: TSH: 2.89 u[IU]/mL (ref 0.35–4.50)

## 2019-08-13 ENCOUNTER — Encounter: Payer: Self-pay | Admitting: Family Medicine

## 2019-08-13 ENCOUNTER — Other Ambulatory Visit: Payer: Self-pay

## 2019-08-13 ENCOUNTER — Ambulatory Visit (INDEPENDENT_AMBULATORY_CARE_PROVIDER_SITE_OTHER): Payer: Medicare Other | Admitting: Family Medicine

## 2019-08-13 VITALS — BP 122/76 | HR 84 | Temp 97.2°F | Ht 63.0 in | Wt 128.4 lb

## 2019-08-13 DIAGNOSIS — C50912 Malignant neoplasm of unspecified site of left female breast: Secondary | ICD-10-CM

## 2019-08-13 DIAGNOSIS — Z0000 Encounter for general adult medical examination without abnormal findings: Secondary | ICD-10-CM | POA: Diagnosis not present

## 2019-08-13 DIAGNOSIS — Z170 Estrogen receptor positive status [ER+]: Secondary | ICD-10-CM | POA: Diagnosis not present

## 2019-08-13 DIAGNOSIS — Z23 Encounter for immunization: Secondary | ICD-10-CM | POA: Diagnosis not present

## 2019-08-13 NOTE — Progress Notes (Signed)
Subjective:    Patient ID: Shari Gonzalez, female    DOB: 30-Jun-1951, 68 y.o.   MRN: [id number]  HPI Here for amw and health mt exam with review of chronic medical problems  I have personally reviewed the Medicare Annual Wellness questionnaire and have noted 1. The patient's medical and social history 2. Their use of alcohol, tobacco or illicit drugs 3. Their current medications and supplements 4. The patient's functional ability including ADL's, fall risks, home safety risks and hearing or visual             impairment. 5. Diet and physical activities 6. Evidence for depression or mood disorders  The patients weight, height, BMI have been recorded in the chart and visual acuity is per eye clinic.  I have made referrals, counseling and provided education to the patient based review of the above and I have provided the pt with a written personalized care plan for preventive services. Reviewed and updated provider list, see scanned forms.  See scanned forms.  Routine anticipatory guidance given to patient.  See health maintenance. Colon cancer screening colonoscopy 2/17  Breast cancer screening mammogram 10/20  Self breast exam-no changes or lumps H/o L breast cancer - doing well with follow up -doing well  Had last visit with Dr Milta Deiters  Flu vaccine- today  Tetanus vaccine  -no ins cov  Pneumovax -due for pna 23 (had prevnar a year ago) Zoster vaccine-has had shingles twice / would like to get the shingrix vaccine  Dexa 6/15 -normal / just had one 10/20 also normal  Falls-none  Fractures- none  Supplements- taking vit D (ca in mvi)  Exercise -outside / does a boot camp and spin class   Advance directive- has adv directive utd  Cognitive function addressed- see scanned forms- and if abnormal then additional documentation follows.  No issues/ memory is pretty good  occ a word comes slowly to her  Handles her own affairs  Likes word games   PMH and SH reviewed  Meds,  vitals, and allergies reviewed.   ROS: See HPI.  Otherwise negative.    She has been tested for covid several times Lost a friend to covid Several family members had covid  She had a virus that was not covid   Weight : Wt Readings from Last 3 Encounters:  08/13/19 128 lb 7 oz (58.3 kg)  08/08/18 122 lb (55.3 kg)  06/18/18 120 lb (54.4 kg)  good weight  Exercises regularly  22.75 kg/m    Hearing/vision:  Hearing Screening   125Hz  250Hz  500Hz  1000Hz  2000Hz  3000Hz  4000Hz  6000Hz  8000Hz   Right ear:   40 40 40  40    Left ear:   40 40 40  40      Visual Acuity Screening   Right eye Left eye Both eyes  Without correction: 20/25 20/50 20/25   With correction:     has not been for eye exam in a while    Cholesterol Lab Results  Component Value Date   CHOL 170 08/06/2019   CHOL 157 08/08/2018   Lab Results  Component Value Date   HDL 58.80 08/06/2019   HDL 63.00 08/08/2018   Lab Results  Component Value Date   LDLCALC 99 08/06/2019   [place] 81 08/08/2018   Lab Results  Component Value Date   TRIG 64.0 08/06/2019   TRIG 69.0 08/08/2018   Lab Results  Component Value Date   CHOLHDL 3 08/06/2019   CHOLHDL 2 08/08/2018  No results found for: LDLDIRECT  LDL is up slightly  She exercises regularly  Perhaps not eating quite as well -more nabs and snack foods  Did cut out red meat    Other labs Results for orders placed or performed in visit on 08/06/19  TSH  Result Value Ref Range   TSH 2.89 0.35 - 4.50 uIU/mL  Lipid panel  Result Value Ref Range   Cholesterol 170 0 - 200 mg/dL   Triglycerides 64.0 0.0 - 149.0 mg/dL   HDL 58.80 >39.00 mg/dL   VLDL 12.8 0.0 - 40.0 mg/dL   LDL Cholesterol 99 0 - 99 mg/dL   Total CHOL/HDL Ratio 3    NonHDL 111.38   Comprehensive metabolic panel  Result Value Ref Range   Sodium 141 135 - 145 mEq/L   Potassium 3.6 3.5 - 5.1 mEq/L   Chloride 104 96 - 112 mEq/L   CO2 29 19 - 32 mEq/L   Glucose, Bld 90 70 - 99 mg/dL    BUN 15 6 - 23 mg/dL   Creatinine, Ser 0.70 0.40 - 1.20 mg/dL   Total Bilirubin 0.7 0.2 - 1.2 mg/dL   Alkaline Phosphatase 87 39 - 117 U/L   AST 18 0 - 37 U/L   ALT 13 0 - 35 U/L   Total Protein 6.6 6.0 - 8.3 g/dL   Albumin 4.5 3.5 - 5.2 g/dL   GFR 83.50 >60.00 mL/min   Calcium 9.4 8.4 - 10.5 mg/dL  CBC with Differential  Result Value Ref Range   WBC 5.7 4.0 - 10.5 K/uL   RBC 4.36 3.87 - 5.11 Mil/uL   Hemoglobin 13.5 12.0 - 15.0 g/dL   HCT 39.8 36.0 - 46.0 %   MCV 91.2 78.0 - 100.0 fl   MCHC 34.0 30.0 - 36.0 g/dL   RDW 13.7 11.5 - 15.5 %   Platelets 188.0 150.0 - 400.0 K/uL   Neutrophils Relative % 74.2 43.0 - 77.0 %   Lymphocytes Relative 17.7 12.0 - 46.0 %   Monocytes Relative 6.1 3.0 - 12.0 %   Eosinophils Relative 1.2 0.0 - 5.0 %   Basophils Relative 0.8 0.0 - 3.0 %   Neutro Abs 4.2 1.4 - 7.7 K/uL   Lymphs Abs 1.0 0.7 - 4.0 K/uL   Monocytes Absolute 0.3 0.1 - 1.0 K/uL   Eosinophils Absolute 0.1 0.0 - 0.7 K/uL   Basophils Absolute 0.0 0.0 - 0.1 K/uL    Patient Active Problem List   Diagnosis Date Noted  . Medicare annual wellness visit, initial 08/13/2019  . Welcome to Medicare preventive visit 08/08/2018  . Encounter for screening for HIV 08/08/2018  . Encounter for hepatitis C screening test for low risk patient 08/08/2018  . Routine general medical examination at a health care facility 07/09/2018  . Trigger thumb, right thumb 11/28/2017  . Allergic rhinitis 08/23/2016  . Patellar tendinitis of left knee 03/08/2016  . Trigger thumb of left hand 12/01/2015  . Perimeniscal cyst of left knee 11/10/2015  . Breast cancer, left breast ([place]) 05/29/2014  . Bloating 04/08/2013   Past Medical History:  Diagnosis Date  . Allergy    seasonal  . Breast cancer ([place])    left   . Cyst of left knee joint   . Diarrhea   . Heart murmur    Past Surgical History:  Procedure Laterality Date  . ABDOMINAL HYSTERECTOMY     complete with BSO  . AUGMENTATION MAMMAPLASTY Bilateral    . BREAST RECONSTRUCTION  WITH PLACEMENT OF TISSUE EXPANDER AND FLEX HD (ACELLULAR HYDRATED DERMIS)    . COLONOSCOPY  2006  . MASTECTOMY Left July 2012    mastectomy   Social History   Tobacco Use  . Smoking status: Never Smoker  . Smokeless tobacco: Never Used  Substance Use Topics  . Alcohol use: Yes    Alcohol/week: 2.0 standard drinks    Types: 2 Glasses of wine per week    Comment: Occasional  . Drug use: No   Family History  Problem Relation Age of Onset  . Alzheimer's disease Mother   . Kidney cancer Father   . Lung cancer Father   . Gout Father   . Diabetes Paternal Grandmother   . Breast cancer Maternal Aunt   . Liver cancer Paternal Grandfather   . Colon cancer Neg Hx   . Colon polyps Neg Hx   . Esophageal cancer Neg Hx   . Rectal cancer Neg Hx   . Stomach cancer Neg Hx    Allergies  Allergen Reactions  . Dilaudid [Hydromorphone Hcl] Rash  . Guaifenesin Er Rash   Current Outpatient Medications on File Prior to Visit  Medication Sig Dispense Refill  . cholecalciferol (VITAMIN D) 1000 units tablet Take 2,000 Units by mouth daily.    . Estradiol 10 MCG TABS vaginal tablet     . loratadine (CLARITIN) 10 MG tablet Take 10 mg by mouth as needed.     . Magnesium Chloride (MAGNESIUM DR PO) Take 1 tablet by mouth daily.    . Multiple Vitamins-Minerals (MULTIVITAMIN WITH MINERALS) tablet Take 1 tablet by mouth daily.      . Probiotic Product (PROBIOTIC DAILY PO) Take by mouth.    . Pyridoxine HCl (VITAMIN B6 PO) Take 1 capsule by mouth daily.    . TURMERIC PO Take by mouth.     No current facility-administered medications on file prior to visit.      Review of Systems  Constitutional: Negative for activity change, appetite change, fatigue, fever and unexpected weight change.  HENT: Negative for congestion, ear pain, rhinorrhea, sinus pressure and sore throat.   Eyes: Negative for pain, redness and visual disturbance.  Respiratory: Negative for cough, shortness  of breath and wheezing.   Cardiovascular: Negative for chest pain and palpitations.  Gastrointestinal: Negative for abdominal pain, blood in stool, constipation and diarrhea.  Endocrine: Negative for polydipsia and polyuria.  Genitourinary: Negative for dysuria, frequency and urgency.  Musculoskeletal: Negative for arthralgias, back pain and myalgias.  Skin: Negative for pallor and rash.  Allergic/Immunologic: Negative for environmental allergies.  Neurological: Negative for dizziness, syncope and headaches.  Hematological: Negative for adenopathy. Does not bruise/bleed easily.  Psychiatric/Behavioral: Negative for decreased concentration and dysphoric mood. The patient is not nervous/anxious.        Stressors- pandemic/sick family members       Objective:   Physical Exam Constitutional:      General: She is not in acute distress.    Appearance: Normal appearance. She is well-developed and normal weight. She is not ill-appearing or diaphoretic.  HENT:     Head: Normocephalic and atraumatic.     Right Ear: Tympanic membrane, ear canal and external ear normal.     Left Ear: Tympanic membrane, ear canal and external ear normal.     Nose: Nose normal. No congestion.     Mouth/Throat:     Mouth: Mucous membranes are moist.     Pharynx: Oropharynx is clear. No posterior oropharyngeal erythema.  Eyes:     General: No scleral icterus.    Extraocular Movements: Extraocular movements intact.     Conjunctiva/sclera: Conjunctivae normal.     Pupils: Pupils are equal, round, and reactive to light.  Neck:     Musculoskeletal: Normal range of motion and neck supple. No neck rigidity or muscular tenderness.     Thyroid: No thyromegaly.     Vascular: No carotid bruit or JVD.  Cardiovascular:     Rate and Rhythm: Normal rate and regular rhythm.     Pulses: Normal pulses.     Heart sounds: Normal heart sounds. No gallop.   Pulmonary:     Effort: Pulmonary effort is normal. No respiratory  distress.     Breath sounds: Normal breath sounds. No wheezing.     Comments: Good air exch Chest:     Chest wall: No tenderness.  Abdominal:     General: Bowel sounds are normal. There is no distension or abdominal bruit.     Palpations: Abdomen is soft. There is no mass.     Tenderness: There is no abdominal tenderness.     Hernia: No hernia is present.  Genitourinary:    Comments: Breast exam (s/p L mastectomy with reconstruction and breast augmentation on the R)  No mass, nodules, thickening, tenderness, bulging, retraction, inflamation, nipple discharge or skin changes noted.  No axillary or clavicular LA.    Baseline surgical changes noted Musculoskeletal: Normal range of motion.        General: No tenderness.     Right lower leg: No edema.     Left lower leg: No edema.  Lymphadenopathy:     Cervical: No cervical adenopathy.  Skin:    General: Skin is warm and dry.     Coloration: Skin is not pale.     Findings: No erythema or rash.     Comments: Solar lentigines diffusely   Neurological:     Mental Status: She is alert. Mental status is at baseline.     Cranial Nerves: No cranial nerve deficit.     Motor: No abnormal muscle tone.     Coordination: Coordination normal.     Gait: Gait normal.     Deep Tendon Reflexes: Reflexes are normal and symmetric. Reflexes normal.  Psychiatric:        Mood and Affect: Mood normal.        Cognition and Memory: Cognition and memory normal.           Assessment & Plan:   Problem List Items Addressed This Visit      Other   Breast cancer, left breast ([place])    No change in breast exam  Mammogram nl 10/20  Continues f/u with oncology   Also sees Dr Nori Riis for gyn      Routine general medical examination at a health care facility    Reviewed health habits including diet and exercise and skin cancer prevention Reviewed appropriate screening tests for age  Also reviewed health mt list, fam hx and immunization status , as well as  social and family history   See HPI Labs reviewed  utd breast cancer care  Flu shot and pna 23 vaccine given today  Discussed shingrix vaccine, she plans to check on coverage Sent for dexa report from gyn 10/20  No falls/fx and taking vit D Adv directive is utd No cognitive concerns  Hearing screen is normal  Vision screen- worse in L eye-she plans to f/u for a formal  eye exam      Medicare annual wellness visit, initial - Primary    Reviewed health habits including diet and exercise and skin cancer prevention Reviewed appropriate screening tests for age  Also reviewed health mt list, fam hx and immunization status , as well as social and family history   See HPI Labs reviewed  utd breast cancer care  Flu shot and pna 23 vaccine given today  Discussed shingrix vaccine, she plans to check on coverage Sent for dexa report from gyn 10/20  No falls/fx and taking vit D Adv directive is utd No cognitive concerns  Hearing screen is normal  Vision screen- worse in L eye-she plans to f/u for a formal eye exam         Other Visit Diagnoses    Need for influenza vaccination       Relevant Orders   Flu Vaccine QUAD High Dose(Fluad) (Completed)   Need for 23-polyvalent pneumococcal polysaccharide vaccine       Relevant Orders   Pneumococcal polysaccharide vaccine 23-valent greater than or equal to 2yo subcutaneous/IM (Completed)

## 2019-08-13 NOTE — Assessment & Plan Note (Signed)
Reviewed health habits including diet and exercise and skin cancer prevention Reviewed appropriate screening tests for age  Also reviewed health mt list, fam hx and immunization status , as well as social and family history   See HPI Labs reviewed  utd breast cancer care  Flu shot and pna 23 vaccine given today  Discussed shingrix vaccine, she plans to check on coverage Sent for dexa report from gyn 10/20  No falls/fx and taking vit D Adv directive is utd No cognitive concerns  Hearing screen is normal  Vision screen- worse in L eye-she plans to f/u for a formal eye exam

## 2019-08-13 NOTE — Assessment & Plan Note (Signed)
No change in breast exam  Mammogram nl 10/20  Continues f/u with oncology   Also sees Dr Nori Riis for gyn

## 2019-08-13 NOTE — Patient Instructions (Addendum)
If you are interested in the new shingles vaccine (Shingrix) - call your local pharmacy to check on coverage and availability  If affordable, get on a wait list at your pharmacy to get the vaccine.   For cholesterol Avoid red meat/ fried foods/ egg yolks/ fatty breakfast meats/ butter, cheese and high fat dairy/ and shellfish    Continue follow up with dermatology

## 2019-10-15 ENCOUNTER — Other Ambulatory Visit: Payer: Medicare Other

## 2019-10-18 ENCOUNTER — Ambulatory Visit: Payer: PPO | Attending: Internal Medicine

## 2019-10-18 DIAGNOSIS — Z23 Encounter for immunization: Secondary | ICD-10-CM

## 2019-10-18 NOTE — Progress Notes (Signed)
   Covid-19 Vaccination Clinic  Name:  Shari Gonzalez    MRN: [id number] DOB: 1951/03/06  10/18/2019  [name] was observed post Covid-19 immunization for 15 minutes without incidence. She was provided with Vaccine Information Sheet and instruction to access the V-Safe system.   [name] was instructed to call 911 with any severe reactions post vaccine: Marland Kitchen Difficulty breathing  . Swelling of your face and throat  . A fast heartbeat  . A bad rash all over your body  . Dizziness and weakness    Immunizations Administered    Name Date Dose VIS Date Route   Pfizer COVID-19 Vaccine 10/18/2019  6:07 PM 0.3 mL 09/06/2019 Intramuscular   Manufacturer: [place]   Lot: [id number]   [place]: [id number]

## 2019-11-08 ENCOUNTER — Ambulatory Visit: Payer: PPO | Attending: Internal Medicine

## 2019-11-08 DIAGNOSIS — Z23 Encounter for immunization: Secondary | ICD-10-CM | POA: Insufficient documentation

## 2019-11-08 NOTE — Progress Notes (Signed)
   Covid-19 Vaccination Clinic  Name:  Shari Gonzalez    MRN: [id number] DOB: 13-Dec-1950  11/08/2019  [name] was observed post Covid-19 immunization for 15 minutes without incidence. She was provided with Vaccine Information Sheet and instruction to access the V-Safe system.   [name] was instructed to call 911 with any severe reactions post vaccine: Marland Kitchen Difficulty breathing  . Swelling of your face and throat  . A fast heartbeat  . A bad rash all over your body  . Dizziness and weakness    Immunizations Administered    Name Date Dose VIS Date Route   Pfizer COVID-19 Vaccine 11/08/2019  5:25 PM 0.3 mL 09/06/2019 Intramuscular   Manufacturer: [place]   Lot: [id number]   [place]: [id number]

## 2019-11-26 DIAGNOSIS — Z85820 Personal history of malignant melanoma of skin: Secondary | ICD-10-CM | POA: Diagnosis not present

## 2019-11-26 DIAGNOSIS — D485 Neoplasm of uncertain behavior of skin: Secondary | ICD-10-CM | POA: Diagnosis not present

## 2019-11-26 DIAGNOSIS — D1801 Hemangioma of skin and subcutaneous tissue: Secondary | ICD-10-CM | POA: Diagnosis not present

## 2019-11-26 DIAGNOSIS — L821 Other seborrheic keratosis: Secondary | ICD-10-CM | POA: Diagnosis not present

## 2019-11-26 DIAGNOSIS — L570 Actinic keratosis: Secondary | ICD-10-CM | POA: Diagnosis not present

## 2019-11-26 DIAGNOSIS — C44519 Basal cell carcinoma of skin of other part of trunk: Secondary | ICD-10-CM | POA: Diagnosis not present

## 2019-11-26 DIAGNOSIS — L814 Other melanin hyperpigmentation: Secondary | ICD-10-CM | POA: Diagnosis not present

## 2019-11-26 DIAGNOSIS — L905 Scar conditions and fibrosis of skin: Secondary | ICD-10-CM | POA: Diagnosis not present

## 2019-11-26 DIAGNOSIS — C44319 Basal cell carcinoma of skin of other parts of face: Secondary | ICD-10-CM | POA: Diagnosis not present

## 2019-11-26 DIAGNOSIS — D225 Melanocytic nevi of trunk: Secondary | ICD-10-CM | POA: Diagnosis not present

## 2019-12-17 DIAGNOSIS — C44519 Basal cell carcinoma of skin of other part of trunk: Secondary | ICD-10-CM | POA: Diagnosis not present

## 2019-12-17 DIAGNOSIS — D485 Neoplasm of uncertain behavior of skin: Secondary | ICD-10-CM | POA: Diagnosis not present

## 2019-12-17 DIAGNOSIS — C44319 Basal cell carcinoma of skin of other parts of face: Secondary | ICD-10-CM | POA: Diagnosis not present

## 2020-01-28 DIAGNOSIS — L905 Scar conditions and fibrosis of skin: Secondary | ICD-10-CM | POA: Diagnosis not present

## 2020-01-28 DIAGNOSIS — Z85828 Personal history of other malignant neoplasm of skin: Secondary | ICD-10-CM | POA: Diagnosis not present

## 2020-01-28 DIAGNOSIS — C44519 Basal cell carcinoma of skin of other part of trunk: Secondary | ICD-10-CM | POA: Diagnosis not present

## 2020-05-05 DIAGNOSIS — D225 Melanocytic nevi of trunk: Secondary | ICD-10-CM | POA: Diagnosis not present

## 2020-05-05 DIAGNOSIS — L910 Hypertrophic scar: Secondary | ICD-10-CM | POA: Diagnosis not present

## 2020-05-05 DIAGNOSIS — L821 Other seborrheic keratosis: Secondary | ICD-10-CM | POA: Diagnosis not present

## 2020-05-05 DIAGNOSIS — L570 Actinic keratosis: Secondary | ICD-10-CM | POA: Diagnosis not present

## 2020-05-05 DIAGNOSIS — Z85828 Personal history of other malignant neoplasm of skin: Secondary | ICD-10-CM | POA: Diagnosis not present

## 2020-05-05 DIAGNOSIS — L814 Other melanin hyperpigmentation: Secondary | ICD-10-CM | POA: Diagnosis not present

## 2020-05-05 DIAGNOSIS — L905 Scar conditions and fibrosis of skin: Secondary | ICD-10-CM | POA: Diagnosis not present

## 2020-06-19 ENCOUNTER — Telehealth: Payer: Self-pay | Admitting: Family Medicine

## 2020-06-19 NOTE — Telephone Encounter (Signed)
A covid vaccine will not cause a test to be positive

## 2020-06-19 NOTE — Telephone Encounter (Signed)
Pt wanted to know She is going on cruise 07/14/20 and wanted to know if she received her booter covid vaccine prior to cruse   Would this cause her to test positive for covid she needs covid test before going on  cruise  Or should she wait till after cruise to get booster

## 2020-06-19 NOTE — Telephone Encounter (Signed)
Left VM letting pt know Dr. Tower's comments  

## 2020-07-12 DIAGNOSIS — Z03818 Encounter for observation for suspected exposure to other biological agents ruled out: Secondary | ICD-10-CM | POA: Diagnosis not present

## 2020-07-12 DIAGNOSIS — Z20822 Contact with and (suspected) exposure to covid-19: Secondary | ICD-10-CM | POA: Diagnosis not present

## 2020-07-14 ENCOUNTER — Telehealth: Payer: Self-pay | Admitting: Family Medicine

## 2020-07-14 NOTE — Telephone Encounter (Signed)
LVM for pt to rtn my call to r/s appt with [facility] on 08/13/20

## 2020-07-28 DIAGNOSIS — Z1231 Encounter for screening mammogram for malignant neoplasm of breast: Secondary | ICD-10-CM | POA: Diagnosis not present

## 2020-07-28 DIAGNOSIS — Z6822 Body mass index (BMI) 22.0-22.9, adult: Secondary | ICD-10-CM | POA: Diagnosis not present

## 2020-07-28 DIAGNOSIS — Z01419 Encounter for gynecological examination (general) (routine) without abnormal findings: Secondary | ICD-10-CM | POA: Diagnosis not present

## 2020-08-05 ENCOUNTER — Telehealth: Payer: Self-pay | Admitting: Family Medicine

## 2020-08-05 DIAGNOSIS — Z0000 Encounter for general adult medical examination without abnormal findings: Secondary | ICD-10-CM

## 2020-08-05 NOTE — Telephone Encounter (Signed)
-----   Message from Ellamae Sia sent at 07/30/2020  4:34 PM EDT ----- Regarding: Lab orders for Thursday, 11.18.21 Patient is scheduled for CPX labs, please order future labs, Thanks , Karna Christmas

## 2020-08-13 ENCOUNTER — Other Ambulatory Visit (INDEPENDENT_AMBULATORY_CARE_PROVIDER_SITE_OTHER): Payer: PPO

## 2020-08-13 ENCOUNTER — Ambulatory Visit: Payer: PPO

## 2020-08-13 ENCOUNTER — Other Ambulatory Visit: Payer: Self-pay

## 2020-08-13 DIAGNOSIS — Z0000 Encounter for general adult medical examination without abnormal findings: Secondary | ICD-10-CM

## 2020-08-13 LAB — CBC WITH DIFFERENTIAL/PLATELET
Basophils Absolute: 0.1 10*3/uL (ref 0.0–0.1)
Basophils Relative: 1.2 % (ref 0.0–3.0)
Eosinophils Absolute: 0.1 10*3/uL (ref 0.0–0.7)
Eosinophils Relative: 1.6 % (ref 0.0–5.0)
HCT: 40 % (ref 36.0–46.0)
Hemoglobin: 13.8 g/dL (ref 12.0–15.0)
Lymphocytes Relative: 16.5 % (ref 12.0–46.0)
Lymphs Abs: 0.8 10*3/uL (ref 0.7–4.0)
MCHC: 34.4 g/dL (ref 30.0–36.0)
MCV: 90.7 fl (ref 78.0–100.0)
Monocytes Absolute: 0.3 10*3/uL (ref 0.1–1.0)
Monocytes Relative: 6.9 % (ref 3.0–12.0)
Neutro Abs: 3.6 10*3/uL (ref 1.4–7.7)
Neutrophils Relative %: 73.8 % (ref 43.0–77.0)
Platelets: 175 10*3/uL (ref 150.0–400.0)
RBC: 4.42 Mil/uL (ref 3.87–5.11)
RDW: 13.8 % (ref 11.5–15.5)
WBC: 4.9 10*3/uL (ref 4.0–10.5)

## 2020-08-13 LAB — TSH: TSH: 3.43 u[IU]/mL (ref 0.35–4.50)

## 2020-08-13 LAB — COMPREHENSIVE METABOLIC PANEL
ALT: 13 U/L (ref 0–35)
AST: 19 U/L (ref 0–37)
Albumin: 4.4 g/dL (ref 3.5–5.2)
Alkaline Phosphatase: 73 U/L (ref 39–117)
BUN: 15 mg/dL (ref 6–23)
CO2: 31 mEq/L (ref 19–32)
Calcium: 9.5 mg/dL (ref 8.4–10.5)
Chloride: 103 mEq/L (ref 96–112)
Creatinine, Ser: 0.84 mg/dL (ref 0.40–1.20)
GFR: 71.67 mL/min (ref >60.00)
Glucose, Bld: 84 mg/dL (ref 70–99)
Potassium: 4.2 mEq/L (ref 3.5–5.1)
Sodium: 141 mEq/L (ref 135–145)
Total Bilirubin: 0.6 mg/dL (ref 0.2–1.2)
Total Protein: 6.6 g/dL (ref 6.0–8.3)

## 2020-08-13 LAB — LIPID PANEL
Cholesterol: 160 mg/dL (ref 0–200)
HDL: 62.2 mg/dL (ref >39.00)
LDL Cholesterol: 85 mg/dL (ref 0–99)
NonHDL: 97.83
Total CHOL/HDL Ratio: 3
Triglycerides: 64 mg/dL (ref 0.0–149.0)
VLDL: 12.8 mg/dL (ref 0.0–40.0)

## 2020-08-19 ENCOUNTER — Ambulatory Visit (INDEPENDENT_AMBULATORY_CARE_PROVIDER_SITE_OTHER): Payer: PPO | Admitting: Family Medicine

## 2020-08-19 ENCOUNTER — Other Ambulatory Visit: Payer: Self-pay

## 2020-08-19 ENCOUNTER — Encounter: Payer: Self-pay | Admitting: Family Medicine

## 2020-08-19 VITALS — BP 130/68 | HR 62 | Temp 96.9°F | Ht 63.0 in | Wt 127.5 lb

## 2020-08-19 DIAGNOSIS — Z853 Personal history of malignant neoplasm of breast: Secondary | ICD-10-CM | POA: Diagnosis not present

## 2020-08-19 DIAGNOSIS — Z0000 Encounter for general adult medical examination without abnormal findings: Secondary | ICD-10-CM | POA: Diagnosis not present

## 2020-08-19 DIAGNOSIS — Z23 Encounter for immunization: Secondary | ICD-10-CM

## 2020-08-19 NOTE — Progress Notes (Signed)
Subjective:    Patient ID: Shari Gonzalez, female    DOB: 1950-11-28, 69 y.o.   MRN: [id number]  This visit occurred during the SARS-CoV-2 public health emergency.  Safety protocols were in place, including screening questions prior to the visit, additional usage of staff PPE, and extensive cleaning of exam room while observing appropriate contact time as indicated for disinfecting solutions.    HPI Here for health maintenance exam and to review chronic medical problems    Wt Readings from Last 3 Encounters:  08/19/20 127 lb 8 oz (57.8 kg)  08/13/19 128 lb 7 oz (58.3 kg)  08/08/18 122 lb (55.3 kg)   22.59 kg/m  Feels fine Busy  Taking care of herself    Flu shot -today  covid status -immunized pfizer - considering a covid booster   pna vaccines -complete Zoster status - interested in shingrix vaccine  Has had shingles twice    [facility] 10[facility]20 -[facility]  H/o L mastectomy -personal h/o breast cancer  Self breast exam - no lumps   Has had a hysterectomy Past- saw Dr Nori Riis for gyn   Colonoscopy 2/17   dexa 6/15 -normal BMD Reports having one 10/20 from gyn -was ok  Falls none Fractures-none  Supplements - taking vit D  Exercise - boot camp and spin 4-5 days per week    BP Readings from Last 3 Encounters:  08/19/20 130/68  08/13/19 122/76  08/08/18 110/64   Pulse Readings from Last 3 Encounters:  08/19/20 62  08/13/19 84  08/08/18 (!) 56   Cholesterol Lab Results  Component Value Date   CHOL 160 08/13/2020   CHOL 170 08/06/2019   CHOL 157 08/08/2018   Lab Results  Component Value Date   HDL 62.20 08/13/2020   HDL 58.80 08/06/2019   HDL 63.00 08/08/2018   Lab Results  Component Value Date   LDLCALC 85 08/13/2020   LDLCALC 99 08/06/2019   LDLCALC 81 08/08/2018   Lab Results  Component Value Date   TRIG 64.0 08/13/2020   TRIG 64.0 08/06/2019   TRIG 69.0 08/08/2018   Lab Results  Component Value Date   CHOLHDL 3  08/13/2020   CHOLHDL 3 08/06/2019   CHOLHDL 2 08/08/2018   No results found for: LDLDIRECT She does eat a healthy diet     Other labs: Results for orders placed or performed in visit on 08/13/20  TSH  Result Value Ref Range   TSH 3.43 0.35 - 4.50 uIU/mL  Lipid panel  Result Value Ref Range   Cholesterol 160 0 - 200 mg/dL   Triglycerides 64.0 0 - 149 mg/dL   HDL 62.20 >39.00 mg/dL   VLDL 12.8 0.0 - 40.0 mg/dL   LDL Cholesterol 85 0 - 99 mg/dL   Total CHOL/HDL Ratio 3    NonHDL 97.83   Comprehensive metabolic panel  Result Value Ref Range   Sodium 141 135 - 145 mEq/L   Potassium 4.2 3.5 - 5.1 mEq/L   Chloride 103 96 - 112 mEq/L   CO2 31 19 - 32 mEq/L   Glucose, Bld 84 70 - 99 mg/dL   BUN 15 6 - 23 mg/dL   Creatinine, Ser 0.84 0.40 - 1.20 mg/dL   Total Bilirubin 0.6 0.2 - 1.2 mg/dL   Alkaline Phosphatase 73 39 - 117 U/L   AST 19 0 - 37 U/L   ALT 13 0 - 35 U/L   Total Protein 6.6 6.0 - 8.3  g/dL   Albumin 4.4 3.5 - 5.2 g/dL   GFR 71.67 >60.00 mL/min   Calcium 9.5 8.4 - 10.5 mg/dL  CBC with Differential/Platelet  Result Value Ref Range   WBC 4.9 4.0 - 10.5 K/uL   RBC 4.42 3.87 - 5.11 Mil/uL   Hemoglobin 13.8 12.0 - 15.0 g/dL   HCT 40.0 36 - 46 %   MCV 90.7 78.0 - 100.0 fl   MCHC 34.4 30.0 - 36.0 g/dL   RDW 13.8 11.5 - 15.5 %   Platelets 175.0 150 - 400 K/uL   Neutrophils Relative % 73.8 43 - 77 %   Lymphocytes Relative 16.5 12 - 46 %   Monocytes Relative 6.9 3 - 12 %   Eosinophils Relative 1.6 0 - 5 %   Basophils Relative 1.2 0 - 3 %   Neutro Abs 3.6 1.4 - 7.7 K/uL   Lymphs Abs 0.8 0.7 - 4.0 K/uL   Monocytes Absolute 0.3 0.1 - 1.0 K/uL   Eosinophils Absolute 0.1 0.0 - 0.7 K/uL   Basophils Absolute 0.1 0.0 - 0.1 K/uL     Patient Active Problem List   Diagnosis Date Noted  . Medicare annual wellness visit, initial 08/13/2019  . Welcome to Medicare preventive visit 08/08/2018  . Encounter for screening for HIV 08/08/2018  . Encounter for hepatitis C screening  test for low risk patient 08/08/2018  . Routine general medical examination at a health care facility 07/09/2018  . Trigger thumb, right thumb 11/28/2017  . Allergic rhinitis 08/23/2016  . Patellar tendinitis of left knee 03/08/2016  . Trigger thumb of left hand 12/01/2015  . Perimeniscal cyst of left knee 11/10/2015  . Breast cancer, left breast ([place]) 05/29/2014  . History of breast cancer 04/14/2014  . Bloating 04/08/2013   Past Medical History:  Diagnosis Date  . Allergy    seasonal  . Breast cancer ([place])    left   . Cyst of left knee joint   . Diarrhea   . Heart murmur    Past Surgical History:  Procedure Laterality Date  . ABDOMINAL HYSTERECTOMY     complete with BSO  . AUGMENTATION MAMMAPLASTY Bilateral   . BREAST RECONSTRUCTION WITH PLACEMENT OF TISSUE EXPANDER AND FLEX HD (ACELLULAR HYDRATED DERMIS)    . COLONOSCOPY  2006  . MASTECTOMY Left July 2012    mastectomy   Social History   Tobacco Use  . Smoking status: Never Smoker  . Smokeless tobacco: Never Used  Substance Use Topics  . Alcohol use: Yes    Alcohol/week: 2.0 standard drinks    Types: 2 Glasses of wine per week    Comment: Occasional  . Drug use: No   Family History  Problem Relation Age of Onset  . Alzheimer's disease Mother   . Kidney cancer Father   . Lung cancer Father   . Gout Father   . Diabetes Paternal Grandmother   . Breast cancer Maternal Aunt   . Liver cancer Paternal Grandfather   . Colon cancer Neg Hx   . Colon polyps Neg Hx   . Esophageal cancer Neg Hx   . Rectal cancer Neg Hx   . Stomach cancer Neg Hx    Allergies  Allergen Reactions  . Dilaudid [Hydromorphone Hcl] Rash  . Guaifenesin Er Rash   Current Outpatient Medications on File Prior to Visit  Medication Sig Dispense Refill  . cholecalciferol (VITAMIN D) 1000 units tablet Take 2,000 Units by mouth daily.    . Estradiol  10 MCG TABS vaginal tablet     . Estrogens, Conjugated (PREMARIN VA) Place 1 tablet vaginally  2 (two) times a week.    . loratadine (CLARITIN) 10 MG tablet Take 10 mg by mouth as needed.     . Magnesium Chloride (MAGNESIUM DR PO) Take 1 tablet by mouth daily.    . Multiple Vitamins-Minerals (MULTIVITAMIN WITH MINERALS) tablet Take 1 tablet by mouth daily.      . Probiotic Product (PROBIOTIC DAILY PO) Take by mouth.    . Pyridoxine HCl (VITAMIN B6 PO) Take 1 capsule by mouth daily.    . TURMERIC PO Take by mouth.     No current facility-administered medications on file prior to visit.    Review of Systems  Constitutional: Negative for activity change, appetite change, fatigue, fever and unexpected weight change.  HENT: Negative for congestion, ear pain, rhinorrhea, sinus pressure and sore throat.   Eyes: Negative for pain, redness and visual disturbance.  Respiratory: Negative for cough, shortness of breath and wheezing.   Cardiovascular: Negative for chest pain and palpitations.  Gastrointestinal: Negative for abdominal pain, blood in stool, constipation and diarrhea.  Endocrine: Negative for polydipsia and polyuria.  Genitourinary: Negative for dysuria, frequency and urgency.  Musculoskeletal: Negative for arthralgias, back pain and myalgias.  Skin: Negative for pallor and rash.  Allergic/Immunologic: Negative for environmental allergies.  Neurological: Negative for dizziness, syncope and headaches.  Hematological: Negative for adenopathy. Does not bruise/bleed easily.  Psychiatric/Behavioral: Negative for decreased concentration and dysphoric mood. The patient is not nervous/anxious.        Objective:   Physical Exam Constitutional:      General: She is not in acute distress.    Appearance: Normal appearance. She is well-developed and normal weight. She is not ill-appearing or diaphoretic.  HENT:     Head: Normocephalic and atraumatic.     Right Ear: Tympanic membrane, ear canal and external ear normal.     Left Ear: Tympanic membrane, ear canal and external ear normal.       Nose: Nose normal. No congestion.     Mouth/Throat:     Mouth: Mucous membranes are moist.     Pharynx: Oropharynx is clear. No posterior oropharyngeal erythema.  Eyes:     General: No scleral icterus.    Extraocular Movements: Extraocular movements intact.     Conjunctiva/sclera: Conjunctivae normal.     Pupils: Pupils are equal, round, and reactive to light.  Neck:     Thyroid: No thyromegaly.     Vascular: No carotid bruit or JVD.  Cardiovascular:     Rate and Rhythm: Normal rate and regular rhythm.     Pulses: Normal pulses.     Heart sounds: Normal heart sounds. No gallop.   Pulmonary:     Effort: Pulmonary effort is normal. No respiratory distress.     Breath sounds: Normal breath sounds. No wheezing.     Comments: Good air exch Chest:     Chest wall: No tenderness.  Abdominal:     General: Bowel sounds are normal. There is no distension or abdominal bruit.     Palpations: Abdomen is soft. There is no mass.     Tenderness: There is no abdominal tenderness.     Hernia: No hernia is present.  Genitourinary:    Comments: Breast and pelvic exam done gy gyn provider Musculoskeletal:        General: No tenderness. Normal range of motion.     Cervical back: Normal  range of motion and neck supple. No rigidity. No muscular tenderness.     Right lower leg: No edema.     Left lower leg: No edema.  Lymphadenopathy:     Cervical: No cervical adenopathy.  Skin:    General: Skin is warm and dry.     Coloration: Skin is not pale.     Findings: No erythema or rash.     Comments: Solar lentigines diffusely   Neurological:     Mental Status: She is alert. Mental status is at baseline.     Cranial Nerves: No cranial nerve deficit.     Motor: No abnormal muscle tone.     Coordination: Coordination normal.     Gait: Gait normal.     Deep Tendon Reflexes: Reflexes are normal and symmetric. Reflexes normal.  Psychiatric:        Mood and Affect: Mood normal.        Cognition and  Memory: Cognition and memory normal.           Assessment & Plan:   Problem List Items Addressed This Visit      Other   History of breast cancer    L mastectomy in the past  Has mammogram at her gyn office       Routine general medical examination at a health care facility - Primary    Reviewed health habits including diet and exercise and skin cancer prevention Reviewed appropriate screening tests for age  Also reviewed health mt list, fam hx and immunization status , as well as social and family history   See HPI Labs reviewed  Sees gyn for mammogram and exam  Nl dexa in 6/15  No falls or fractures and taking vit D with great exercise       Relevant Orders   Flu Vaccine QUAD High Dose(Fluad) (Completed)    Other Visit Diagnoses    Need for influenza vaccination       Relevant Orders   Flu Vaccine QUAD High Dose(Fluad) (Completed)

## 2020-08-19 NOTE — Patient Instructions (Addendum)
If you are interested in the new shingles vaccine (Shingrix) - call your local pharmacy to check on coverage and availability  If affordable, get on a wait list at your pharmacy to get the vaccine.    Flu shot today   I think a covid booster is a good idea

## 2020-08-21 NOTE — Assessment & Plan Note (Signed)
L mastectomy in the past  Has mammogram at her gyn office

## 2020-08-21 NOTE — Assessment & Plan Note (Signed)
Reviewed health habits including diet and exercise and skin cancer prevention Reviewed appropriate screening tests for age  Also reviewed health mt list, fam hx and immunization status , as well as social and family history   See HPI Labs reviewed  Sees gyn for mammogram and exam  Nl dexa in 6/15  No falls or fractures and taking vit D with great exercise

## 2020-09-16 ENCOUNTER — Ambulatory Visit: Payer: PPO

## 2020-09-16 ENCOUNTER — Telehealth: Payer: Self-pay

## 2020-09-16 ENCOUNTER — Other Ambulatory Visit: Payer: Self-pay

## 2020-09-16 NOTE — Telephone Encounter (Signed)
Called patient to complete AWV. Patient never answered. Called 3 times. Left message notifying patient I called and appointment would be cancelled. Advised to call back and reschedule.

## 2020-11-26 DIAGNOSIS — L814 Other melanin hyperpigmentation: Secondary | ICD-10-CM | POA: Diagnosis not present

## 2020-11-26 DIAGNOSIS — L738 Other specified follicular disorders: Secondary | ICD-10-CM | POA: Diagnosis not present

## 2020-11-26 DIAGNOSIS — L905 Scar conditions and fibrosis of skin: Secondary | ICD-10-CM | POA: Diagnosis not present

## 2020-11-26 DIAGNOSIS — D225 Melanocytic nevi of trunk: Secondary | ICD-10-CM | POA: Diagnosis not present

## 2020-11-26 DIAGNOSIS — D2239 Melanocytic nevi of other parts of face: Secondary | ICD-10-CM | POA: Diagnosis not present

## 2020-11-26 DIAGNOSIS — L821 Other seborrheic keratosis: Secondary | ICD-10-CM | POA: Diagnosis not present

## 2020-11-26 DIAGNOSIS — L570 Actinic keratosis: Secondary | ICD-10-CM | POA: Diagnosis not present

## 2020-11-26 DIAGNOSIS — D485 Neoplasm of uncertain behavior of skin: Secondary | ICD-10-CM | POA: Diagnosis not present

## 2020-11-26 DIAGNOSIS — D1801 Hemangioma of skin and subcutaneous tissue: Secondary | ICD-10-CM | POA: Diagnosis not present

## 2020-11-26 DIAGNOSIS — Z85828 Personal history of other malignant neoplasm of skin: Secondary | ICD-10-CM | POA: Diagnosis not present

## 2021-03-15 ENCOUNTER — Telehealth: Payer: Self-pay

## 2021-03-15 NOTE — Telephone Encounter (Signed)
Started 03-12-21 with sore throat. Then 03-13-21 was tired. Felt hot that night that not but did not check temp. Yesterday am 100.4 Took ibuprofen. Ran fever all day. Took home Covid test yesterday and was positive. Has dry cough and head "feels heavy".  Fever this am 102. Asking if she can get an antiviral sent to [facility]. Please advise at [phone].   Message sent to Dr Glori Bickers through [employer]

## 2021-03-15 NOTE — Telephone Encounter (Signed)
Patient advised and worked in at 12:30 pm with Dr Glori Bickers tomorrow 03/16/21 for virtual visit.

## 2021-03-15 NOTE — Telephone Encounter (Signed)
Please schedule virtual visit with first available  Thanks for letting me know

## 2021-03-16 ENCOUNTER — Encounter: Payer: Self-pay | Admitting: Family Medicine

## 2021-03-16 ENCOUNTER — Telehealth: Payer: Self-pay

## 2021-03-16 ENCOUNTER — Telehealth (INDEPENDENT_AMBULATORY_CARE_PROVIDER_SITE_OTHER): Payer: PPO | Admitting: Family Medicine

## 2021-03-16 DIAGNOSIS — U071 COVID-19: Secondary | ICD-10-CM | POA: Diagnosis not present

## 2021-03-16 MED ORDER — HYDROCOD POLST-CPM POLST ER 10-8 MG/5ML PO SUER: 5.0000 mL | Freq: Two times a day (BID) | ORAL | 0 refills | Status: DC | PRN

## 2021-03-16 MED ORDER — MOLNUPIRAVIR EUA 200MG CAPSULE: 4.0000 | ORAL_CAPSULE | Freq: Two times a day (BID) | ORAL | 0 refills | Status: DC

## 2021-03-16 MED ORDER — MOLNUPIRAVIR EUA 200MG CAPSULE: 4.0000 | ORAL_CAPSULE | Freq: Two times a day (BID) | ORAL | 0 refills | Status: AC

## 2021-03-16 NOTE — Telephone Encounter (Signed)
Received call from patient the Tussionex is not covered by insurance. Wanted to know if something different can be called in. [facility]

## 2021-03-16 NOTE — Telephone Encounter (Signed)
Patient advised.

## 2021-03-16 NOTE — Telephone Encounter (Signed)
Done, thanks

## 2021-03-16 NOTE — Telephone Encounter (Signed)
She has drug allergy to dilaudid and also to expectorant guaifenesin so options are limited  Has she tried delsym ?(that has DM but not guiafen)-that is otc also Please call in tessalon 200 mg 1 po tid prn #30 no ref  Swallow whole, do not chew pill

## 2021-03-16 NOTE — Progress Notes (Signed)
Virtual Visit via Video Note  I connected with Shari Gonzalez on 03/16/21 at 12:30 PM EDT by a video enabled telemedicine application and verified that I am speaking with the correct person using two identifiers.  Location: Patient: home Provider: office   I discussed the limitations of evaluation and management by telemedicine and the availability of in person appointments. The patient expressed understanding and agreed to proceed.  Parties involved in encounter  Patient: Education officer, museum   Provider:  Loura Pardon MD   History of Present Illness: Pt presents for c/o covid 19 with positive test   Got covid from her husband   Started on 6/17 with a sore throat an fatigue  On 6/19 had temp 100.4 Covid pos yesterday with temp 102  Temp today  Nasal congestion /sinus  Clear mucous so far Ears bothered her a little -better now  Throat was scratchy, better now (worse from cough and pnd)  A little diarrhea after bkfast  (nothing to eat since then)  No n/v  Headache is frontal   Some cough -taking tussionex  Not productive  No wheeze or sob  Very tired   Also an otc severe sinus med Ibuprofen also    Covid immunized times 2  Patient Active Problem List   Diagnosis Date Noted   COVID-19 03/16/2021   Medicare annual wellness visit, initial 08/13/2019   Welcome to Medicare preventive visit 08/08/2018   Encounter for screening for HIV 08/08/2018   Encounter for hepatitis C screening test for low risk patient 08/08/2018   Routine general medical examination at a health care facility 07/09/2018   Trigger thumb, right thumb 11/28/2017   Allergic rhinitis 08/23/2016   Patellar tendinitis of left knee 03/08/2016   Trigger thumb of left hand 12/01/2015   Perimeniscal cyst of left knee 11/10/2015   Breast cancer, left breast ([place]) 05/29/2014   History of breast cancer 04/14/2014   Bloating 04/08/2013   Past Medical History:  Diagnosis Date   Allergy    seasonal    Breast cancer ([place])    left    Cyst of left knee joint    Diarrhea    Heart murmur    Past Surgical History:  Procedure Laterality Date   ABDOMINAL HYSTERECTOMY     complete with BSO   AUGMENTATION MAMMAPLASTY Bilateral    BREAST RECONSTRUCTION WITH PLACEMENT OF TISSUE EXPANDER AND FLEX HD (ACELLULAR HYDRATED DERMIS)     COLONOSCOPY  2006   MASTECTOMY Left July 2012    mastectomy   Social History   Tobacco Use   Smoking status: Never   Smokeless tobacco: Never  Substance Use Topics   Alcohol use: Yes    Alcohol/week: 2.0 standard drinks    Types: 2 Glasses of wine per week    Comment: Occasional   Drug use: No   Family History  Problem Relation Age of Onset   Alzheimer's disease Mother    Kidney cancer Father    Lung cancer Father    Gout Father    Diabetes Paternal Grandmother    Breast cancer Maternal Aunt    Liver cancer Paternal Grandfather    Colon cancer Neg Hx    Colon polyps Neg Hx    Esophageal cancer Neg Hx    Rectal cancer Neg Hx    Stomach cancer Neg Hx    Allergies  Allergen Reactions   Dilaudid [Hydromorphone Hcl] Rash   Guaifenesin Er Rash   Current Outpatient Medications on File Prior  to Visit  Medication Sig Dispense Refill   cholecalciferol (VITAMIN D) 1000 units tablet Take 2,000 Units by mouth daily.     Estradiol 10 MCG TABS vaginal tablet      Estrogens, Conjugated (PREMARIN VA) Place 1 tablet vaginally 2 (two) times a week.     loratadine (CLARITIN) 10 MG tablet Take 10 mg by mouth as needed.      Magnesium Chloride (MAGNESIUM DR PO) Take 1 tablet by mouth daily.     Multiple Vitamins-Minerals (MULTIVITAMIN WITH MINERALS) tablet Take 1 tablet by mouth daily.       Probiotic Product (PROBIOTIC DAILY PO) Take by mouth.     Pyridoxine HCl (VITAMIN B6 PO) Take 1 capsule by mouth daily.     TURMERIC PO Take by mouth.     No current facility-administered medications on file prior to visit.   Review of Systems  Constitutional:  Positive  for fever and malaise/fatigue. Negative for chills.  HENT:  Positive for congestion. Negative for ear pain, sinus pain and sore throat.   Eyes:  Negative for blurred vision, discharge and redness.  Respiratory:  Positive for cough. Negative for sputum production, shortness of breath, wheezing and stridor.   Cardiovascular:  Negative for chest pain, palpitations and leg swelling.  Gastrointestinal:  Negative for abdominal pain, diarrhea, nausea and vomiting.  Musculoskeletal:  Negative for myalgias.  Skin:  Negative for rash.  Neurological:  Positive for headaches. Negative for dizziness.   Observations/Objective: Patient appears well, in no distress (fatigued appearing) Weight is baseline  No facial swelling or asymmetry Normal voice-not hoarse and no slurred speech No obvious tremor or mobility impairment Moving neck and UEs normally Able to hear the call well  No wheeze or shortness of breath during interview  Occ cough is dry, clears her throat  Talkative and mentally sharp with no cognitive changes No skin changes on face or neck , no rash or pallor Affect is normal    Assessment and Plan: Problem List Items Addressed This Visit       Other   COVID-19    Started symptoms on 6/17  Fever is improved today with otc tx  molnupiravir px -disc dosing Adv to continue to isolate until symptoms are better Call back and ER parameters discussed (esp if sob or wheezing) tussionex sent for cough as she is almost out  Will continue ibuprofen prn pain/temp         Follow Up Instructions: Drink fluids and rest  Use ibuprofen as needed  Nasal saline for congestion as needed  Tylenol for fever or pain or headache  I sent in a refill for tussionex for cough-take with caution of sedation Take the molnupiavir for covid as directed  Isolate yourself until symptoms resolve   Please alert [place] if symptoms worsen (if severe or short of breath please go to the ER)    I discussed the  assessment and treatment plan with the patient. The patient was provided an opportunity to ask questions and all were answered. The patient agreed with the plan and demonstrated an understanding of the instructions.   The patient was advised to call back or seek an in-person evaluation if the symptoms worsen or if the condition fails to improve as anticipated.     Loura Pardon, MD

## 2021-03-16 NOTE — Assessment & Plan Note (Signed)
Started symptoms on 6/17  Fever is improved today with otc tx  molnupiravir px -disc dosing Adv to continue to isolate until symptoms are better Call back and ER parameters discussed (esp if sob or wheezing) tussionex sent for cough as she is almost out  Will continue ibuprofen prn pain/temp

## 2021-03-16 NOTE — Telephone Encounter (Signed)
Patient called stating that the 2 medications that were sent in to [facility] today could not be processed with them, they do not have it. Patient asking to re send these to [facility]-I pulled down the medication and pharmacy and pended for review.  Please let patient know when this is done. Thank you.

## 2021-03-16 NOTE — Patient Instructions (Signed)
Drink fluids and rest  Use ibuprofen as needed  Nasal saline for congestion as needed  Tylenol for fever or pain or headache  I sent in a refill for tussionex for cough-take with caution of sedation Take the molnupiavir for covid as directed  Isolate yourself until symptoms resolve   Please alert [place] if symptoms worsen (if severe or short of breath please go to the ER)

## 2021-03-17 NOTE — Telephone Encounter (Signed)
Returning phone call form joellen

## 2021-03-17 NOTE — Telephone Encounter (Signed)
Left message to return call to our office.  

## 2021-03-19 NOTE — Telephone Encounter (Signed)
Pt did end up getting Rx at a different pharmacy, she hasn't needed med and is okay for now. Has med to use PRN

## 2021-05-21 DIAGNOSIS — Z20822 Contact with and (suspected) exposure to covid-19: Secondary | ICD-10-CM | POA: Diagnosis not present

## 2021-09-01 DIAGNOSIS — Z1231 Encounter for screening mammogram for malignant neoplasm of breast: Secondary | ICD-10-CM | POA: Diagnosis not present

## 2021-09-07 ENCOUNTER — Encounter: Payer: Self-pay | Admitting: Family Medicine

## 2021-09-07 ENCOUNTER — Other Ambulatory Visit: Payer: Self-pay

## 2021-09-07 ENCOUNTER — Ambulatory Visit (INDEPENDENT_AMBULATORY_CARE_PROVIDER_SITE_OTHER): Payer: PPO | Admitting: Family Medicine

## 2021-09-07 VITALS — BP 130/70 | HR 71 | Temp 98.1°F | Ht 63.0 in | Wt 123.4 lb

## 2021-09-07 DIAGNOSIS — R519 Headache, unspecified: Secondary | ICD-10-CM | POA: Diagnosis not present

## 2021-09-07 DIAGNOSIS — S0990XA Unspecified injury of head, initial encounter: Secondary | ICD-10-CM

## 2021-09-07 DIAGNOSIS — W19XXXA Unspecified fall, initial encounter: Secondary | ICD-10-CM | POA: Diagnosis not present

## 2021-09-07 DIAGNOSIS — S060X0A Concussion without loss of consciousness, initial encounter: Secondary | ICD-10-CM | POA: Diagnosis not present

## 2021-09-07 NOTE — Patient Instructions (Addendum)
Use tylenol for pain. Can use ice  if needed.  We will work on setting up CT scan of head and facial bones.  Brain rest..  wait until next week to return to [facility].  Go to ER if  severe headache develops or new neurologic tissues.

## 2021-09-07 NOTE — Progress Notes (Signed)
Patient ID: Shari Gonzalez, female    DOB: [birth year], 70 y.o.   MRN: [id number]  This visit was conducted in person.  Pulse 71    Temp 98.1 F (36.7 C) (Temporal)    Ht 5\' 3"  (1.6 m)    Wt 123 lb 6 oz (56 kg)    SpO2 99%    BMI 21.85 kg/m    CC: Chief Complaint  Patient presents with   Fall    Hit Head above right eyebrow on Saturday    Subjective:   HPI: Shari Gonzalez is a 70 y.o. female  patient's of Dr. Marliss Coots presenting on 09/07/2021 for Fall (Hit Head above right eyebrow on Saturday)     She reports  on  12/10she was in her normal state of health until  she was walking to get on a float in a parade. She was not watching her feet and she mis-stepped. No proceeding symptoms, no CP, no SOB, no lightheadedness. She lost lost balance and landed on cement... body landed on a blanket but she hit right eyebrow.  No LOC, but she felt dazed for a few seconds.  Stood up and was able to walk.  Had immediate swelling... applied ice.   Mild  lightheadedness on 12/11/ and 12/12, some sun sensitivity, no headache, no neck pain.  Today lightheadedness is gone.    She has been using aleve and ibuprofen. Has not need to take anything today.4  No blood thinner.      Relevant past medical, surgical, family and social history reviewed and updated as indicated. Interim medical history since our last visit reviewed. Allergies and medications reviewed and updated. Outpatient Medications Prior to Visit  Medication Sig Dispense Refill   chlorpheniramine-HYDROcodone (TUSSIONEX PENNKINETIC ER) 10-8 MG/5ML SUER Take 5 mLs by mouth every 12 (twelve) hours as needed for cough. 60 mL 0   cholecalciferol (VITAMIN D) 1000 units tablet Take 2,000 Units by mouth daily.     Estradiol 10 MCG TABS vaginal tablet      Estrogens, Conjugated (PREMARIN VA) Place 1 tablet vaginally 2 (two) times a week.     loratadine (CLARITIN) 10 MG tablet Take 10 mg by mouth as needed.      Magnesium  Chloride (MAGNESIUM DR PO) Take 1 tablet by mouth daily.     Multiple Vitamins-Minerals (MULTIVITAMIN WITH MINERALS) tablet Take 1 tablet by mouth daily.       Probiotic Product (PROBIOTIC DAILY PO) Take by mouth.     Pyridoxine HCl (VITAMIN B6 PO) Take 1 capsule by mouth daily.     TURMERIC PO Take by mouth.     No facility-administered medications prior to visit.     Per HPI unless specifically indicated in ROS section below Review of Systems Objective:  Pulse 71    Temp 98.1 F (36.7 C) (Temporal)    Ht 5\' 3"  (1.6 m)    Wt 123 lb 6 oz (56 kg)    SpO2 99%    BMI 21.85 kg/m   Wt Readings from Last 3 Encounters:  09/07/21 123 lb 6 oz (56 kg)  03/16/21 122 lb (55.3 kg)  08/19/20 127 lb 8 oz (57.8 kg)      Physical Exam    Results for orders placed or performed in visit on 08/13/20  TSH  Result Value Ref Range   TSH 3.43 0.35 - 4.50 uIU/mL  Lipid panel  Result Value Ref Range   Cholesterol 160 0 - 200  mg/dL   Triglycerides 64.0 0.0 - 149.0 mg/dL   HDL 62.20 >39.00 mg/dL   VLDL 12.8 0.0 - 40.0 mg/dL   LDL Cholesterol 85 0 - 99 mg/dL   Total CHOL/HDL Ratio 3    NonHDL 97.83   Comprehensive metabolic panel  Result Value Ref Range   Sodium 141 135 - 145 mEq/L   Potassium 4.2 3.5 - 5.1 mEq/L   Chloride 103 96 - 112 mEq/L   CO2 31 19 - 32 mEq/L   Glucose, Bld 84 70 - 99 mg/dL   BUN 15 6 - 23 mg/dL   Creatinine, Ser 0.84 0.40 - 1.20 mg/dL   Total Bilirubin 0.6 0.2 - 1.2 mg/dL   Alkaline Phosphatase 73 39 - 117 U/L   AST 19 0 - 37 U/L   ALT 13 0 - 35 U/L   Total Protein 6.6 6.0 - 8.3 g/dL   Albumin 4.4 3.5 - 5.2 g/dL   GFR 71.67 >60.00 mL/min   Calcium 9.5 8.4 - 10.5 mg/dL  CBC with Differential/Platelet  Result Value Ref Range   WBC 4.9 4.0 - 10.5 K/uL   RBC 4.42 3.87 - 5.11 Mil/uL   Hemoglobin 13.8 12.0 - 15.0 g/dL   HCT 40.0 36.0 - 46.0 %   MCV 90.7 78.0 - 100.0 fl   MCHC 34.4 30.0 - 36.0 g/dL   RDW 13.8 11.5 - 15.5 %   Platelets 175.0 150.0 - 400.0 K/uL    Neutrophils Relative % 73.8 43.0 - 77.0 %   Lymphocytes Relative 16.5 12.0 - 46.0 %   Monocytes Relative 6.9 3.0 - 12.0 %   Eosinophils Relative 1.6 0.0 - 5.0 %   Basophils Relative 1.2 0.0 - 3.0 %   Neutro Abs 3.6 1.4 - 7.7 K/uL   Lymphs Abs 0.8 0.7 - 4.0 K/uL   Monocytes Absolute 0.3 0.1 - 1.0 K/uL   Eosinophils Absolute 0.1 0.0 - 0.7 K/uL   Basophils Absolute 0.1 0.0 - 0.1 K/uL    This visit occurred during the SARS-CoV-2 public health emergency.  Safety protocols were in place, including screening questions prior to the visit, additional usage of staff PPE, and extensive cleaning of exam room while observing appropriate contact time as indicated for disinfecting solutions.   COVID 19 screen:  No recent travel or known exposure to COVID19 The patient denies respiratory symptoms of COVID 19 at this time. The importance of social distancing was discussed today.   Assessment and Plan Problem List Items Addressed This Visit     Accidental fall - Primary   Relevant Orders   CT HEAD WO CONTRAST (5MM)   Other Visit Diagnoses     Traumatic injury of head, initial encounter       Relevant Orders   CT HEAD WO CONTRAST (5MM)   Concussion without loss of consciousness, initial encounter       Relevant Orders   CT HEAD WO CONTRAST (5MM)   Right-sided face pain       Relevant Orders   CT HEAD WO CONTRAST (5MM)      Normal neuro exam,likely resolving concussion.  Possible fracture of facial bones  Use tylenol for pain. Can use ice  if needed.  Eval with CT scan of head and facial bones.  Brain rest..  wait until next week to return to [facility].  Go to ER if  severe headache develops or new neurologic tissues.    Eliezer Lofts, MD

## 2021-09-08 ENCOUNTER — Ambulatory Visit
Admission: RE | Admit: 2021-09-08 | Discharge: 2021-09-08 | Disposition: A | Discharge status: Home / Self Care | Payer: PPO | Source: Ambulatory Visit | Attending: Family Medicine | Admitting: Family Medicine

## 2021-09-08 DIAGNOSIS — S0990XA Unspecified injury of head, initial encounter: Secondary | ICD-10-CM

## 2021-09-08 DIAGNOSIS — W19XXXA Unspecified fall, initial encounter: Secondary | ICD-10-CM | POA: Diagnosis not present

## 2021-09-08 DIAGNOSIS — S060X0A Concussion without loss of consciousness, initial encounter: Secondary | ICD-10-CM | POA: Diagnosis not present

## 2021-09-08 DIAGNOSIS — R519 Headache, unspecified: Secondary | ICD-10-CM | POA: Diagnosis not present

## 2021-09-13 DIAGNOSIS — Z1272 Encounter for screening for malignant neoplasm of vagina: Secondary | ICD-10-CM | POA: Diagnosis not present

## 2021-09-13 DIAGNOSIS — Z124 Encounter for screening for malignant neoplasm of cervix: Secondary | ICD-10-CM | POA: Diagnosis not present

## 2021-09-13 DIAGNOSIS — Z90711 Acquired absence of uterus with remaining cervical stump: Secondary | ICD-10-CM | POA: Diagnosis not present

## 2021-09-13 DIAGNOSIS — Z90710 Acquired absence of both cervix and uterus: Secondary | ICD-10-CM | POA: Diagnosis not present

## 2021-09-13 DIAGNOSIS — Z6821 Body mass index (BMI) 21.0-21.9, adult: Secondary | ICD-10-CM | POA: Diagnosis not present

## 2021-10-04 ENCOUNTER — Telehealth: Payer: Self-pay | Admitting: Family Medicine

## 2021-10-04 DIAGNOSIS — Z0000 Encounter for general adult medical examination without abnormal findings: Secondary | ICD-10-CM

## 2021-10-04 NOTE — Telephone Encounter (Signed)
-----   Message from Ellamae Sia sent at 09/21/2021  8:33 AM EST ----- Regarding: Lab orders for Wednesday, 1.11.23 Patient is scheduled for CPX labs, please order future labs, Thanks , Karna Christmas

## 2021-10-06 ENCOUNTER — Other Ambulatory Visit: Payer: PPO

## 2021-10-13 ENCOUNTER — Encounter: Payer: Self-pay | Admitting: Family Medicine

## 2021-10-13 ENCOUNTER — Ambulatory Visit (INDEPENDENT_AMBULATORY_CARE_PROVIDER_SITE_OTHER): Payer: PPO | Admitting: Family Medicine

## 2021-10-13 ENCOUNTER — Other Ambulatory Visit: Payer: Self-pay

## 2021-10-13 VITALS — BP 130/70 | HR 66 | Temp 97.9°F | Ht 63.0 in | Wt 123.2 lb

## 2021-10-13 DIAGNOSIS — Z0000 Encounter for general adult medical examination without abnormal findings: Secondary | ICD-10-CM | POA: Insufficient documentation

## 2021-10-13 DIAGNOSIS — Z1211 Encounter for screening for malignant neoplasm of colon: Secondary | ICD-10-CM | POA: Insufficient documentation

## 2021-10-13 DIAGNOSIS — Z23 Encounter for immunization: Secondary | ICD-10-CM | POA: Diagnosis not present

## 2021-10-13 LAB — LIPID PANEL
Cholesterol: 180 mg/dL (ref 0–200)
HDL: 67.6 mg/dL (ref >39.00)
LDL Cholesterol: 102 mg/dL — ABNORMAL HIGH (ref 0–99)
NonHDL: 112.43
Total CHOL/HDL Ratio: 3
Triglycerides: 53 mg/dL (ref 0.0–149.0)
VLDL: 10.6 mg/dL (ref 0.0–40.0)

## 2021-10-13 LAB — COMPREHENSIVE METABOLIC PANEL
ALT: 18 U/L (ref 0–35)
AST: 24 U/L (ref 0–37)
Albumin: 4.8 g/dL (ref 3.5–5.2)
Alkaline Phosphatase: 81 U/L (ref 39–117)
BUN: 15 mg/dL (ref 6–23)
CO2: 27 mEq/L (ref 19–32)
Calcium: 9.8 mg/dL (ref 8.4–10.5)
Chloride: 104 mEq/L (ref 96–112)
Creatinine, Ser: 0.79 mg/dL (ref 0.40–1.20)
GFR: 76.52 mL/min (ref >60.00)
Glucose, Bld: 72 mg/dL (ref 70–99)
Potassium: 4.3 mEq/L (ref 3.5–5.1)
Sodium: 138 mEq/L (ref 135–145)
Total Bilirubin: 0.9 mg/dL (ref 0.2–1.2)
Total Protein: 7 g/dL (ref 6.0–8.3)

## 2021-10-13 LAB — CBC WITH DIFFERENTIAL/PLATELET
Basophils Absolute: 0.1 10*3/uL (ref 0.0–0.1)
Basophils Relative: 0.9 % (ref 0.0–3.0)
Eosinophils Absolute: 0.1 10*3/uL (ref 0.0–0.7)
Eosinophils Relative: 1.7 % (ref 0.0–5.0)
HCT: 40.7 % (ref 36.0–46.0)
Hemoglobin: 13.6 g/dL (ref 12.0–15.0)
Lymphocytes Relative: 24.5 % (ref 12.0–46.0)
Lymphs Abs: 1.3 10*3/uL (ref 0.7–4.0)
MCHC: 33.4 g/dL (ref 30.0–36.0)
MCV: 90.7 fl (ref 78.0–100.0)
Monocytes Absolute: 0.3 10*3/uL (ref 0.1–1.0)
Monocytes Relative: 5.9 % (ref 3.0–12.0)
Neutro Abs: 3.7 10*3/uL (ref 1.4–7.7)
Neutrophils Relative %: 67 % (ref 43.0–77.0)
Platelets: 191 10*3/uL (ref 150.0–400.0)
RBC: 4.49 Mil/uL (ref 3.87–5.11)
RDW: 13.6 % (ref 11.5–15.5)
WBC: 5.5 10*3/uL (ref 4.0–10.5)

## 2021-10-13 LAB — TSH: TSH: 2.51 u[IU]/mL (ref 0.35–5.50)

## 2021-10-13 NOTE — Assessment & Plan Note (Signed)
Colonoscopy 2017  Nl h/o polyps cologuard ordered

## 2021-10-13 NOTE — Assessment & Plan Note (Signed)
Reviewed health habits including diet and exercise and skin cancer prevention Reviewed appropriate screening tests for age  Also reviewed health mt list, fam hx and immunization status , as well as social and family history   Good health habits  Labs ordered Colonoscopy 2017, ordered cologuard today for screening  Mammogram utd from gyn sent for report  Gyn care utd  Flu vaccine given Tetanus shot postponed for insurance Interested in shingrix perhaps later One fall noted, no fractures dexa utd-sent for report from gyn, taking ca and D Advance directive is utd No cognitive concerns Nl hearing screen and eye/vision exam is utd,sched next week PHQ score of 0 No help needed with ADLs , good function

## 2021-10-13 NOTE — Progress Notes (Signed)
Subjective:    Patient ID: Shari Gonzalez, female    DOB: 1951-03-03, 71 y.o.   MRN: [id number]  This visit occurred during the SARS-CoV-2 public health emergency.  Safety protocols were in place, including screening questions prior to the visit, additional usage of staff PPE, and extensive cleaning of exam room while observing appropriate contact time as indicated for disinfecting solutions.   HPI Pt presents for amw and health mt visit   I have personally reviewed the Medicare Annual Wellness questionnaire and have noted 1. The patient's medical and social history 2. Their use of alcohol, tobacco or illicit drugs 3. Their current medications and supplements 4. The patient's functional ability including ADL's, fall risks, home safety risks and hearing or visual             impairment. 5. Diet and physical activities 6. Evidence for depression or mood disorders  The patients weight, height, BMI have been recorded in the chart and visual acuity is per eye clinic.  I have made referrals, counseling and provided education to the patient based review of the above and I have provided the pt with a written personalized care plan for preventive services. Reviewed and updated provider list, see scanned forms.  See scanned forms.  Routine anticipatory guidance given to patient.  See health maintenance. Colon cancer screening  colonoscpy 10/2015 Breast cancer screening  mammogram 07/2020  = had one last mo at gyn Personal h/o breast cancer Self breast exam: no lumps or changes  Saw gyn provider last month Flu vaccine- today  Covid vaccinated Tetanus vaccine  postponed for insurance Pneumovax completed  Zoster vaccine- has had shingles twice /considering shingrix in the future  Dexa 02/2014- bmd in the normal range Falls-one in December , mis step getting to a float at a [employer] face, no fractures, nl CT head Fractures-none  Supplements-vit D and mvi  Exercise - boot camp and  spin regularly    Advance directive: up to date  Cognitive function addressed- see scanned forms- and if abnormal then additional documentation follows.   No changes in memory  Occ word finding/name but comes eventually  Mother had alzheimers so she watches closely    PMH and SH reviewed  Meds, vitals, and allergies reviewed.   ROS: See HPI.  Otherwise negative.    Weight : Wt Readings from Last 3 Encounters:  10/13/21 123 lb 4 oz (55.9 kg)  09/07/21 123 lb 6 oz (56 kg)  03/16/21 122 lb (55.3 kg)   21.83 kg/m  Eats healthy and exercise   Hearing/vision: Hearing Screening   500Hz  1000Hz  2000Hz  4000Hz   Right ear 40 40 40 40  Left ear 40 40 40 40  Vision Screening - Comments:: Eye exam scheduled Jan 2023 at [facility]  Some vision c/o - may need glasses at this point  Distance is worse     PHQ: Depression screen [facility] 2/9 10/13/2021 08/19/2020 08/13/2019 08/08/2018  Decreased Interest 0 0 0 0  Down, Depressed, Hopeless 0 0 0 0  PHQ - 2 Score 0 0 0 0     ADLs: no help needed   Functionality: very good  Does finances   Care team    BP Readings from Last 3 Encounters:  10/13/21 130/70  09/07/21 130/70  08/19/20 130/68   Pulse Readings from Last 3 Encounters:  10/13/21 66  09/07/21 71  08/19/20 62   No new family history   Will get labs today  Occ R sided pelvic/low abd pain  On/off   Had total hysterectomy   Cannot overdo fiber-bothers her GI system Watches diet closely    Patient Active Problem List   Diagnosis Date Noted   Medicare annual wellness visit, subsequent 10/13/2021   Colon cancer screening 10/13/2021   Accidental fall 09/07/2021   COVID-19 03/16/2021   Medicare annual wellness visit, initial 08/13/2019   Welcome to Medicare preventive visit 08/08/2018   Encounter for screening for HIV 08/08/2018   Encounter for hepatitis C screening test for low risk patient 08/08/2018   Routine general medical examination at a  health care facility 07/09/2018   Allergic rhinitis 08/23/2016   Patellar tendinitis of left knee 03/08/2016   Perimeniscal cyst of left knee 11/10/2015   Breast cancer, left breast ([place]) 05/29/2014   History of breast cancer 04/14/2014   Bloating 04/08/2013   Past Medical History:  Diagnosis Date   Allergy    seasonal   Breast cancer ([place])    left    Cyst of left knee joint    Diarrhea    Heart murmur    Past Surgical History:  Procedure Laterality Date   ABDOMINAL HYSTERECTOMY     complete with BSO   AUGMENTATION MAMMAPLASTY Bilateral    BREAST RECONSTRUCTION WITH PLACEMENT OF TISSUE EXPANDER AND FLEX HD (ACELLULAR HYDRATED DERMIS)     COLONOSCOPY  2006   MASTECTOMY Left July 2012    mastectomy   Social History   Tobacco Use   Smoking status: Never   Smokeless tobacco: Never  Substance Use Topics   Alcohol use: Yes    Alcohol/week: 2.0 standard drinks    Types: 2 Glasses of wine per week    Comment: Occasional   Drug use: No   Family History  Problem Relation Age of Onset   Alzheimer's disease Mother    Kidney cancer Father    Lung cancer Father    Gout Father    Diabetes Paternal Grandmother    Breast cancer Maternal Aunt    Liver cancer Paternal Grandfather    Colon cancer Neg Hx    Colon polyps Neg Hx    Esophageal cancer Neg Hx    Rectal cancer Neg Hx    Stomach cancer Neg Hx    Allergies  Allergen Reactions   Dilaudid [Hydromorphone Hcl] Rash   Guaifenesin Er Rash   Current Outpatient Medications on File Prior to Visit  Medication Sig Dispense Refill   cholecalciferol (VITAMIN D) 1000 units tablet Take 2,000 Units by mouth daily.     Estradiol 10 MCG TABS vaginal tablet      loratadine (CLARITIN) 10 MG tablet Take 10 mg by mouth as needed.      Magnesium Chloride (MAGNESIUM DR PO) Take 1 tablet by mouth daily.     Multiple Vitamins-Minerals (MULTIVITAMIN WITH MINERALS) tablet Take 1 tablet by mouth daily.       Probiotic Product (PROBIOTIC  DAILY PO) Take by mouth.     Pyridoxine HCl (VITAMIN B6 PO) Take 1 capsule by mouth daily.     TURMERIC PO Take by mouth.     No current facility-administered medications on file prior to visit.      Review of Systems  Constitutional:  Negative for activity change, appetite change, fatigue, fever and unexpected weight change.  HENT:  Negative for congestion, ear pain, rhinorrhea, sinus pressure and sore throat.   Eyes:  Negative for pain, redness and visual disturbance.  Respiratory:  Negative  for cough, shortness of breath and wheezing.   Cardiovascular:  Negative for chest pain and palpitations.  Gastrointestinal:  Negative for abdominal distention, anal bleeding, blood in stool, constipation, diarrhea, nausea, rectal pain and vomiting.       Occ R lower abd/pelvic pain  Short lived  Endocrine: Negative for polydipsia and polyuria.  Genitourinary:  Negative for dysuria, frequency and urgency.  Musculoskeletal:  Negative for arthralgias, back pain and myalgias.  Skin:  Negative for pallor and rash.  Allergic/Immunologic: Negative for environmental allergies.  Neurological:  Negative for dizziness, syncope and headaches.  Hematological:  Negative for adenopathy. Does not bruise/bleed easily.  Psychiatric/Behavioral:  Negative for decreased concentration and dysphoric mood. The patient is not nervous/anxious.       Objective:   Physical Exam Constitutional:      General: She is not in acute distress.    Appearance: Normal appearance. She is well-developed and normal weight. She is not ill-appearing or diaphoretic.  HENT:     Head: Normocephalic and atraumatic.     Right Ear: Tympanic membrane, ear canal and external ear normal.     Left Ear: Tympanic membrane, ear canal and external ear normal.     Nose: Nose normal. No congestion.     Mouth/Throat:     Mouth: Mucous membranes are moist.     Pharynx: Oropharynx is clear. No posterior oropharyngeal erythema.  Eyes:     General:  No scleral icterus.    Extraocular Movements: Extraocular movements intact.     Conjunctiva/sclera: Conjunctivae normal.     Pupils: Pupils are equal, round, and reactive to light.  Neck:     Thyroid: No thyromegaly.     Vascular: No carotid bruit or JVD.  Cardiovascular:     Rate and Rhythm: Normal rate and regular rhythm.     Pulses: Normal pulses.     Heart sounds: Normal heart sounds.    No gallop.  Pulmonary:     Effort: Pulmonary effort is normal. No respiratory distress.     Breath sounds: Normal breath sounds. No wheezing.     Comments: Good air exch Chest:     Chest wall: No tenderness.  Abdominal:     General: Bowel sounds are normal. There is no distension or abdominal bruit.     Palpations: Abdomen is soft. There is no mass.     Tenderness: There is no abdominal tenderness.     Hernia: No hernia is present.  Genitourinary:    Comments: Breast exam done by gyn provider  Musculoskeletal:        General: No tenderness. Normal range of motion.     Cervical back: Normal range of motion and neck supple. No rigidity. No muscular tenderness.     Right lower leg: No edema.     Left lower leg: No edema.  Lymphadenopathy:     Cervical: No cervical adenopathy.  Skin:    General: Skin is warm and dry.     Coloration: Skin is not pale.     Findings: No erythema or rash.     Comments: Solar lentigines diffusely Solar aging  Neurological:     Mental Status: She is alert. Mental status is at baseline.     Cranial Nerves: No cranial nerve deficit.     Motor: No abnormal muscle tone.     Coordination: Coordination normal.     Gait: Gait normal.     Deep Tendon Reflexes: Reflexes are normal and symmetric. Reflexes normal.  Psychiatric:        Mood and Affect: Mood normal.        Cognition and Memory: Cognition and memory normal.          Assessment & Plan:   Problem List Items Addressed This Visit       Other   Routine general medical examination at a health care  facility - Primary    Reviewed health habits including diet and exercise and skin cancer prevention Reviewed appropriate screening tests for age  Also reviewed health mt list, fam hx and immunization status , as well as social and family history   Good health habits  Labs ordered Colonoscopy 2017, ordered cologuard today for screening  Mammogram utd from gyn sent for report  Gyn care utd  Flu vaccine given Tetanus shot postponed for insurance Interested in shingrix perhaps later One fall noted, no fractures dexa utd-sent for report from gyn, taking ca and D Advance directive is utd No cognitive concerns Nl hearing screen and eye/vision exam is utd,sched next week PHQ score of 0 No help needed with ADLs , good function        Relevant Orders   Flu Vaccine QUAD High Dose(Fluad) (Completed)   Medicare annual wellness visit, subsequent    Reviewed health habits including diet and exercise and skin cancer prevention Reviewed appropriate screening tests for age  Also reviewed health mt list, fam hx and immunization status , as well as social and family history   Good health habits  Labs ordered Colonoscopy 2017, ordered cologuard today for screening  Mammogram utd from gyn sent for report  Gyn care utd  Flu vaccine given Tetanus shot postponed for insurance Interested in shingrix perhaps later One fall noted, no fractures dexa utd-sent for report from gyn, taking ca and D Advance directive is utd No cognitive concerns Nl hearing screen and eye/vision exam is utd,sched next week PHQ score of 0 No help needed with ADLs , good function      Colon cancer screening    Colonoscopy 2017  Nl h/o polyps cologuard ordered       Relevant Orders   Cologuard   Other Visit Diagnoses     Need for influenza vaccination       Relevant Orders   Flu Vaccine QUAD High Dose(Fluad) (Completed)

## 2021-10-13 NOTE — Patient Instructions (Addendum)
If you are interested in the new shingles vaccine (Shingrix) - call your local pharmacy to check on coverage and availability  If affordable, get on a wait list at your pharmacy to get the vaccine.   Stop up front to send for mammogram and dexa report   I ordered cologuard test to be delivered for colon cancer screening  If pain returns and stays - please let us know   Keep drinking lots of fluids and eat a healthy diet   Flu shot today

## 2021-10-16 ENCOUNTER — Encounter: Payer: Self-pay | Admitting: Family Medicine

## 2021-10-22 DIAGNOSIS — H2513 Age-related nuclear cataract, bilateral: Secondary | ICD-10-CM | POA: Diagnosis not present

## 2021-10-24 LAB — COLOGUARD

## 2021-11-04 DIAGNOSIS — Z1211 Encounter for screening for malignant neoplasm of colon: Secondary | ICD-10-CM | POA: Diagnosis not present

## 2021-11-11 LAB — COLOGUARD: COLOGUARD: NEGATIVE

## 2022-01-17 DIAGNOSIS — L814 Other melanin hyperpigmentation: Secondary | ICD-10-CM | POA: Diagnosis not present

## 2022-01-17 DIAGNOSIS — Z85828 Personal history of other malignant neoplasm of skin: Secondary | ICD-10-CM | POA: Diagnosis not present

## 2022-01-17 DIAGNOSIS — D225 Melanocytic nevi of trunk: Secondary | ICD-10-CM | POA: Diagnosis not present

## 2022-01-17 DIAGNOSIS — D485 Neoplasm of uncertain behavior of skin: Secondary | ICD-10-CM | POA: Diagnosis not present

## 2022-01-17 DIAGNOSIS — Z08 Encounter for follow-up examination after completed treatment for malignant neoplasm: Secondary | ICD-10-CM | POA: Diagnosis not present

## 2022-01-17 DIAGNOSIS — L821 Other seborrheic keratosis: Secondary | ICD-10-CM | POA: Diagnosis not present

## 2022-01-17 DIAGNOSIS — Z872 Personal history of diseases of the skin and subcutaneous tissue: Secondary | ICD-10-CM | POA: Diagnosis not present

## 2022-01-17 DIAGNOSIS — L570 Actinic keratosis: Secondary | ICD-10-CM | POA: Diagnosis not present

## 2022-02-02 DIAGNOSIS — L570 Actinic keratosis: Secondary | ICD-10-CM | POA: Diagnosis not present

## 2022-02-02 DIAGNOSIS — D489 Neoplasm of uncertain behavior, unspecified: Secondary | ICD-10-CM | POA: Diagnosis not present

## 2022-02-02 DIAGNOSIS — L905 Scar conditions and fibrosis of skin: Secondary | ICD-10-CM | POA: Diagnosis not present

## 2022-02-02 DIAGNOSIS — L080 Pyoderma: Secondary | ICD-10-CM | POA: Diagnosis not present

## 2022-04-21 NOTE — Progress Notes (Signed)
[place] Phone: [phone] Subjective:   [name], am serving as a scribe for Dr. Hulan Saas.  I'm seeing this patient by the request  of:  Tower, Wynelle Fanny, MD  CC: Left hip pain  [id number]  Last seen in 2019 for thumb pain  Shari Gonzalez is a 71 y.o. female coming in with complaint of pain in L groin for past 2 months. Patient does workout and over the weekend she had increase in pain after doing 150 sit up and some leg crunches. Hurt to get into bed to lie on that side on Monday night. Has worked out since Monday and has not had pain. Notes a catching at times.      Past Medical History:  Diagnosis Date   Allergy    seasonal   Breast cancer ([place])    left    Cyst of left knee joint    Diarrhea    Heart murmur    Past Surgical History:  Procedure Laterality Date   ABDOMINAL HYSTERECTOMY     complete with BSO   AUGMENTATION MAMMAPLASTY Bilateral    BREAST RECONSTRUCTION WITH PLACEMENT OF TISSUE EXPANDER AND FLEX HD (ACELLULAR HYDRATED DERMIS)     COLONOSCOPY  2006   MASTECTOMY Left July 2012    mastectomy   Social History   Socioeconomic History   Marital status: Married    Spouse name: Not on file   Number of children: 2   Years of education: Not on file   Highest education level: Not on file  Occupational History    Employer: [employer]  Tobacco Use   Smoking status: Never   Smokeless tobacco: Never  Substance and Sexual Activity   Alcohol use: Yes    Alcohol/week: 2.0 standard drinks of alcohol    Types: 2 Glasses of wine per week    Comment: Occasional   Drug use: No   Sexual activity: Yes    Birth control/protection: None  Other Topics Concern   Not on file  Social History Narrative   Not on file   Social Determinants of Health   Financial Resource Strain: Not on file  Food Insecurity: Not on file  Transportation Needs: Not on file  Physical  Activity: Not on file  Stress: Not on file  Social Connections: Not on file   Allergies  Allergen Reactions   Dilaudid [Hydromorphone Hcl] Rash   Guaifenesin Er Rash   Family History  Problem Relation Age of Onset   Alzheimer's disease Mother    Kidney cancer Father    Lung cancer Father    Gout Father    Diabetes Paternal Grandmother    Breast cancer Maternal Aunt    Liver cancer Paternal Grandfather    Colon cancer Neg Hx    Colon polyps Neg Hx    Esophageal cancer Neg Hx    Rectal cancer Neg Hx    Stomach cancer Neg Hx       Current Outpatient Medications (Respiratory):    loratadine (CLARITIN) 10 MG tablet, Take 10 mg by mouth as needed.   Current Outpatient Medications (Analgesics):    meloxicam (MOBIC) 7.5 MG tablet, Take 1 tablet (7.5 mg total) by mouth daily.   Current Outpatient Medications (Other):    cholecalciferol (VITAMIN D) 1000 units tablet, Take 2,000 Units by mouth daily.   Estradiol 10 MCG TABS vaginal tablet,    Magnesium Chloride (MAGNESIUM DR  PO), Take 1 tablet by mouth daily.   Multiple Vitamins-Minerals (MULTIVITAMIN WITH MINERALS) tablet, Take 1 tablet by mouth daily.     Probiotic Product (PROBIOTIC DAILY PO), Take by mouth.   Pyridoxine HCl (VITAMIN B6 PO), Take 1 capsule by mouth daily.   TURMERIC PO, Take by mouth.   Reviewed prior external information including notes and imaging from  primary care provider As well as notes that were available from care everywhere and other healthcare systems.  Past medical history, social, surgical and family history all reviewed in electronic medical record.  No pertanent information unless stated regarding to the chief complaint.   Review of Systems:  No headache, visual changes, nausea, vomiting, diarrhea, constipation, dizziness, abdominal pain, skin rash, fevers, chills, night sweats, weight loss, swollen lymph nodes, body aches, joint swelling, chest pain, shortness of breath, mood changes.  POSITIVE muscle aches  Objective  Blood pressure 126/82, pulse 75, height '5\' 3"'$  (1.6 m), weight 130 lb (59 kg), SpO2 97 %.   General: No apparent distress alert and oriented x3 mood and affect normal, dressed appropriately.  HEENT: Pupils equal, extraocular movements intact  Respiratory: Patient's speak in full sentences and does not appear short of breath  Cardiovascular: No lower extremity edema, non tender, no erythema  Left hip exam shows the patient does have some limited internal and external range of motion.  Patient now has a negative grind test noted.  Negative straight leg test.  Tenderness to palpation over the groin area.  No masses appreciated.  No worsening pain with Valsalva.  Patient ambulates very carefully with a minorly externally rotated left hip      Impression and Recommendations:     The above documentation has been reviewed and is accurate and complete Lyndal Pulley, DO

## 2022-04-22 ENCOUNTER — Ambulatory Visit (INDEPENDENT_AMBULATORY_CARE_PROVIDER_SITE_OTHER)
Admission: RE | Admit: 2022-04-22 | Discharge: 2022-04-22 | Disposition: A | Discharge status: Home / Self Care | Payer: PPO | Source: Ambulatory Visit | Attending: Family Medicine | Admitting: Family Medicine

## 2022-04-22 ENCOUNTER — Encounter: Payer: Self-pay | Admitting: Family Medicine

## 2022-04-22 ENCOUNTER — Ambulatory Visit (INDEPENDENT_AMBULATORY_CARE_PROVIDER_SITE_OTHER): Payer: PPO | Admitting: Family Medicine

## 2022-04-22 VITALS — BP 126/82 | HR 75 | Ht 63.0 in | Wt 130.0 lb

## 2022-04-22 DIAGNOSIS — M25552 Pain in left hip: Secondary | ICD-10-CM

## 2022-04-22 DIAGNOSIS — M1612 Unilateral primary osteoarthritis, left hip: Secondary | ICD-10-CM | POA: Diagnosis not present

## 2022-04-22 MED ORDER — MELOXICAM 7.5 MG PO TABS: 7.5000 mg | ORAL_TABLET | Freq: Every day | ORAL | 0 refills | Status: DC

## 2022-04-22 NOTE — Patient Instructions (Addendum)
L hip xray at [facility] office Hip flexor tendonitis Meloxicam 7.'5mg'$  daily Do not use NSAIDS such as Advil or Aleve when taking Meloxicam It is ok to use Tylenol for additional pain relief See me in 4-6 weeks

## 2022-04-22 NOTE — Assessment & Plan Note (Signed)
Concern for the significant limited range of motion.  Patient has a negative grind test and a negative fulcrum test I do not think it is a occult fracture.  I am concerned though that there could be some underlying arthritis that could be contributing.  Home exercises given.  Unfortunately x-ray is down in our office so ordering it in another facility.  Depending on the amount of arthritic changes we will discuss further.  We will discuss more low impact exercises for now.  Discussed icing regimen.  Follow-up again in 6 to 8 weeks otherwise.

## 2022-05-04 DIAGNOSIS — L538 Other specified erythematous conditions: Secondary | ICD-10-CM | POA: Diagnosis not present

## 2022-05-04 DIAGNOSIS — Z872 Personal history of diseases of the skin and subcutaneous tissue: Secondary | ICD-10-CM | POA: Diagnosis not present

## 2022-05-04 DIAGNOSIS — Z09 Encounter for follow-up examination after completed treatment for conditions other than malignant neoplasm: Secondary | ICD-10-CM | POA: Diagnosis not present

## 2022-05-04 DIAGNOSIS — D485 Neoplasm of uncertain behavior of skin: Secondary | ICD-10-CM | POA: Diagnosis not present

## 2022-05-04 DIAGNOSIS — L570 Actinic keratosis: Secondary | ICD-10-CM | POA: Diagnosis not present

## 2022-05-04 DIAGNOSIS — L820 Inflamed seborrheic keratosis: Secondary | ICD-10-CM | POA: Diagnosis not present

## 2022-05-18 NOTE — Progress Notes (Signed)
[place] Phone: [phone] Subjective:   [name], am serving as a scribe for Dr. Hulan Saas.  I'm seeing this patient by the request  of:  Tower, Wynelle Fanny, MD  CC: Left leg pain follow-up  [id number]  04/22/2022 Concern for the significant limited range of motion.  Patient has a negative grind test and a negative fulcrum test I do not think it is a occult fracture.  I am concerned though that there could be some underlying arthritis that could be contributing.  Home exercises given.  Unfortunately x-ray is down in our office so ordering it in another facility.  Depending on the amount of arthritic changes we will discuss further.  We will discuss more low impact exercises for now.  Discussed icing regimen.  Follow-up again in 6 to 8 weeks otherwise.  Updated 05/19/2022 Kyona Chauncey Rieke is a 71 y.o. female coming in with complaint of L groin pain. Patient states that she is doing better. Less pain and catching. Feels like Meloxicam is helping.   Also having L heel pain on bottom of heel for past 4 weeks. Painful in mornings and after she has been sitting for prolonged periods.        Past Medical History:  Diagnosis Date   Allergy    seasonal   Breast cancer ([place])    left    Cyst of left knee joint    Diarrhea    Heart murmur    Past Surgical History:  Procedure Laterality Date   ABDOMINAL HYSTERECTOMY     complete with BSO   AUGMENTATION MAMMAPLASTY Bilateral    BREAST RECONSTRUCTION WITH PLACEMENT OF TISSUE EXPANDER AND FLEX HD (ACELLULAR HYDRATED DERMIS)     COLONOSCOPY  2006   MASTECTOMY Left July 2012    mastectomy   Social History   Socioeconomic History   Marital status: Married    Spouse name: Not on file   Number of children: 2   Years of education: Not on file   Highest education level: Not on file  Occupational History    Employer: [employer]  Tobacco Use    Smoking status: Never   Smokeless tobacco: Never  Substance and Sexual Activity   Alcohol use: Yes    Alcohol/week: 2.0 standard drinks of alcohol    Types: 2 Glasses of wine per week    Comment: Occasional   Drug use: No   Sexual activity: Yes    Birth control/protection: None  Other Topics Concern   Not on file  Social History Narrative   Not on file   Social Determinants of Health   Financial Resource Strain: Not on file  Food Insecurity: Not on file  Transportation Needs: Not on file  Physical Activity: Not on file  Stress: Not on file  Social Connections: Not on file   Allergies  Allergen Reactions   Dilaudid [Hydromorphone Hcl] Rash   Guaifenesin Er Rash   Family History  Problem Relation Age of Onset   Alzheimer's disease Mother    Kidney cancer Father    Lung cancer Father    Gout Father    Diabetes Paternal Grandmother    Breast cancer Maternal Aunt    Liver cancer Paternal Grandfather    Colon cancer Neg Hx    Colon polyps Neg Hx    Esophageal cancer Neg Hx    Rectal cancer Neg Hx    Stomach cancer  Neg Hx       Current Outpatient Medications (Respiratory):    loratadine (CLARITIN) 10 MG tablet, Take 10 mg by mouth as needed.   Current Outpatient Medications (Analgesics):    meloxicam (MOBIC) 7.5 MG tablet, Take 1 tablet (7.5 mg total) by mouth daily.   Current Outpatient Medications (Other):    cholecalciferol (VITAMIN D) 1000 units tablet, Take 2,000 Units by mouth daily.   Estradiol 10 MCG TABS vaginal tablet,    Magnesium Chloride (MAGNESIUM DR PO), Take 1 tablet by mouth daily.   Multiple Vitamins-Minerals (MULTIVITAMIN WITH MINERALS) tablet, Take 1 tablet by mouth daily.     Probiotic Product (PROBIOTIC DAILY PO), Take by mouth.   Pyridoxine HCl (VITAMIN B6 PO), Take 1 capsule by mouth daily.   TURMERIC PO, Take by mouth.   Reviewed prior external information including notes and imaging from  primary care provider As well as notes that  were available from care everywhere and other healthcare systems.  Past medical history, social, surgical and family history all reviewed in electronic medical record.  No pertanent information unless stated regarding to the chief complaint.   Review of Systems:  No headache, visual changes, nausea, vomiting, diarrhea, constipation, dizziness, abdominal pain, skin rash, fevers, chills, night sweats, weight loss, swollen lymph nodes, body aches, joint swelling, chest pain, shortness of breath, mood changes. POSITIVE muscle aches  Objective  Blood pressure 122/68, pulse 81, height '5\' 3"'$  (1.6 m), weight 128 lb (58.1 kg), SpO2 98 %.   General: No apparent distress alert and oriented x3 mood and affect normal, dressed appropriately.  HEENT: Pupils equal, extraocular movements intact  Respiratory: Patient's speak in full sentences and does not appear short of breath  Cardiovascular: No lower extremity edema, non tender, no erythema  Left hip still has some limited range of motion especially with internal rotation on the left compared to the right.  Mild pain with this as well still in the groin area.  Negative straight leg test. Left heel exam shows the patient does have tenderness to palpation over the calcaneal area and more on the medial side.  Patient has mild breakdown of the longitudinal and breakdown of the transverse arch of the left foot greater than right.  Limited muscular skeletal ultrasound was performed and interpreted by Hulan Saas, M  Limited ultrasound shows the patient does have some thickening noted of the plantar fascia.  Hypoechoic changes in this area as well.  No cortical irregularity noted. Impression: Plantar fasciitis    Impression and Recommendations:      The above documentation has been reviewed and is accurate and complete Lyndal Pulley, DO

## 2022-05-19 ENCOUNTER — Ambulatory Visit: Payer: PPO | Admitting: Family Medicine

## 2022-05-19 ENCOUNTER — Ambulatory Visit: Payer: Self-pay

## 2022-05-19 VITALS — BP 122/68 | HR 81 | Ht 63.0 in | Wt 128.0 lb

## 2022-05-19 DIAGNOSIS — M79672 Pain in left foot: Secondary | ICD-10-CM | POA: Diagnosis not present

## 2022-05-19 DIAGNOSIS — M722 Plantar fascial fibromatosis: Secondary | ICD-10-CM

## 2022-05-19 NOTE — Patient Instructions (Signed)
Do prescribed exercises at least 3x a week Recovery sandals Oofos or [place] Adidas or Newtons shoes Spenco total orthotics 1.5g of essential amino acids Vit C 500-'1000mg'$ 

## 2022-05-20 ENCOUNTER — Encounter: Payer: Self-pay | Admitting: Family Medicine

## 2022-05-20 DIAGNOSIS — M722 Plantar fascial fibromatosis: Secondary | ICD-10-CM | POA: Insufficient documentation

## 2022-05-20 NOTE — Assessment & Plan Note (Addendum)
We reviewed that stretching is critically important to the treatment of PF.  Reviewed footwear. Rigid soles have been shown to help with PF. Night splints can sometimes help. Reviewed rehab of stretching and calf raises.   Could benefit from a corticosteroid injection, orthotics, or other measures if conservative treatment fails. Worsening pain will consider the injection at follow-up or PRP.  We discussed supplementations as well over-the-counter that could be more beneficial including amino acids.  Has meloxicam for breakthrough.

## 2022-08-29 ENCOUNTER — Telehealth (INDEPENDENT_AMBULATORY_CARE_PROVIDER_SITE_OTHER): Payer: PPO | Admitting: Family Medicine

## 2022-08-29 ENCOUNTER — Encounter: Payer: Self-pay | Admitting: Family Medicine

## 2022-08-29 VITALS — Temp 101.0°F

## 2022-08-29 DIAGNOSIS — U071 COVID-19: Secondary | ICD-10-CM

## 2022-08-29 MED ORDER — NIRMATRELVIR/RITONAVIR (PAXLOVID)TABLET: 3.0000 | ORAL_TABLET | Freq: Two times a day (BID) | ORAL | 0 refills | Status: AC

## 2022-08-29 MED ORDER — BENZONATATE 200 MG PO CAPS: 200.0000 mg | ORAL_CAPSULE | Freq: Three times a day (TID) | ORAL | 1 refills | Status: DC | PRN

## 2022-08-29 NOTE — Patient Instructions (Addendum)
Drink fluids and rest  Take paxlovid as directed  Try the tessalon for cough  Treat your symptoms   Alert [place] if symptoms worsen or if you have wheezing or trouble breathing (go to the ER if severe)   Isolate until symptoms are better (minimum 5 days) Then mask for 10 additional days   Update if not starting to improve in a week or if worsening

## 2022-08-29 NOTE — Progress Notes (Signed)
Virtual Visit via Video Note  I connected with Shari Gonzalez on 08/29/22 at  3:00 PM EST by a video enabled telemedicine application and verified that I am speaking with the correct person using two identifiers.  Location: Patient: home Provider: office   I discussed the limitations of evaluation and management by telemedicine and the availability of in person appointments. The patient expressed understanding and agreed to proceed.  Parties involved in encounter  Patient: Education officer, museum   Provider:  Loura Pardon MD   History of Present Illness: Pt presents with covid 19   Symptoms started yesterday Did a home test- pos    headache  Sneezing and runny nose , some congestion  Pnd and losing her voice  Eyes watering  Temp of 101 today - went up to 102.7 last night   Coughing started today / mostly dry  No sob  No wheezing    Body aches  Hot and cold   Husband had covid 2 wk ago  She took a trip to [place]:  Cold and flu combi med Acetaminophen (does not mix)  Fluids  No nasal spray so far    Lab Results  Component Value Date   CREATININE 0.79 10/13/2021   BUN 15 10/13/2021   NA 138 10/13/2021   K 4.3 10/13/2021   CL 104 10/13/2021   CO2 27 10/13/2021   Last GFR 76.52  Patient Active Problem List   Diagnosis Date Noted   Plantar fasciitis of left foot 05/20/2022   Left hip pain 04/22/2022   Medicare annual wellness visit, subsequent 10/13/2021   Colon cancer screening 10/13/2021   Accidental fall 09/07/2021   COVID-19 03/16/2021   Medicare annual wellness visit, initial 08/13/2019   Welcome to Medicare preventive visit 08/08/2018   Encounter for screening for HIV 08/08/2018   Encounter for hepatitis C screening test for low risk patient 08/08/2018   Routine general medical examination at a health care facility 07/09/2018   Allergic rhinitis 08/23/2016   Patellar tendinitis of left knee 03/08/2016   Perimeniscal cyst of left knee 11/10/2015    Breast cancer, left breast ([place]) 05/29/2014   History of breast cancer 04/14/2014   Bloating 04/08/2013   Past Medical History:  Diagnosis Date   Allergy    seasonal   Breast cancer ([place])    left    Cyst of left knee joint    Diarrhea    Heart murmur    Past Surgical History:  Procedure Laterality Date   ABDOMINAL HYSTERECTOMY     complete with BSO   AUGMENTATION MAMMAPLASTY Bilateral    BREAST RECONSTRUCTION WITH PLACEMENT OF TISSUE EXPANDER AND FLEX HD (ACELLULAR HYDRATED DERMIS)     COLONOSCOPY  2006   MASTECTOMY Left July 2012    mastectomy   Social History   Tobacco Use   Smoking status: Never   Smokeless tobacco: Never  Substance Use Topics   Alcohol use: Yes    Alcohol/week: 2.0 standard drinks of alcohol    Types: 2 Glasses of wine per week    Comment: Occasional   Drug use: No   Family History  Problem Relation Age of Onset   Alzheimer's disease Mother    Kidney cancer Father    Lung cancer Father    Gout Father    Diabetes Paternal Grandmother    Breast cancer Maternal Aunt    Liver cancer Paternal Grandfather    Colon cancer Neg Hx    Colon  polyps Neg Hx    Esophageal cancer Neg Hx    Rectal cancer Neg Hx    Stomach cancer Neg Hx    Allergies  Allergen Reactions   Dilaudid [Hydromorphone Hcl] Rash   Guaifenesin Er Rash   Current Outpatient Medications on File Prior to Visit  Medication Sig Dispense Refill   cholecalciferol (VITAMIN D) 1000 units tablet Take 2,000 Units by mouth daily.     Estradiol 10 MCG TABS vaginal tablet Twice weekly     loratadine (CLARITIN) 10 MG tablet Take 10 mg by mouth as needed.      Magnesium Chloride (MAGNESIUM DR PO) Take 1 tablet by mouth daily.     Multiple Vitamins-Minerals (MULTIVITAMIN WITH MINERALS) tablet Take 1 tablet by mouth daily.       Probiotic Product (PROBIOTIC DAILY PO) Take by mouth.     Pyridoxine HCl (VITAMIN B6 PO) Take 1 capsule by mouth daily.     TURMERIC PO Take by mouth.     No  current facility-administered medications on file prior to visit.   Review of Systems  Constitutional:  Positive for chills, fever and malaise/fatigue.  HENT:  Positive for sore throat. Negative for congestion, ear pain and sinus pain.   Eyes:  Negative for blurred vision, discharge and redness.  Respiratory:  Positive for cough. Negative for sputum production, shortness of breath, wheezing and stridor.   Cardiovascular:  Negative for chest pain, palpitations and leg swelling.  Gastrointestinal:  Negative for abdominal pain, diarrhea, nausea and vomiting.  Musculoskeletal:  Negative for myalgias.  Skin:  Negative for rash.  Neurological:  Positive for headaches. Negative for dizziness.     Observations/Objective:  Patient appears well, in no distress Weight is baseline  No facial swelling or asymmetry Mildly hoarse voice  No obvious tremor or mobility impairment Moving neck and UEs normally Able to hear the call well  No cough or shortness of breath during interview (does clear her throat)  Talkative and mentally sharp with no cognitive changes No skin changes on face or neck , no rash or pallor Affect is normal   Assessment and Plan: Problem List Items Addressed This Visit       Other   COVID-19 - Primary    Day 2 of sympt with pos home test  Was traveling in [place] last week   Px paxlovid-inst to call if side eff Tessalon for cough Antic guidance Disc sympt care Disc ER precautions   Update if not starting to improve in a week or if worsening    Inst to isolate until sympt are better/ 5 d min and then mask for 10 d       Relevant Medications   nirmatrelvir/ritonavir EUA (PAXLOVID) 20 x 150 MG & 10 x '100MG'$  TABS     Follow Up Instructions: Drink fluids and rest  Take paxlovid as directed  Try the tessalon for cough  Treat your symptoms   Alert [place] if symptoms worsen or if you have wheezing or trouble breathing (go to the ER if severe)   Isolate until symptoms  are better (minimum 5 days) Then mask for 10 additional days   Update if not starting to improve in a week or if worsening     I discussed the assessment and treatment plan with the patient. The patient was provided an opportunity to ask questions and all were answered. The patient agreed with the plan and demonstrated an understanding of the instructions.   The patient  was advised to call back or seek an in-person evaluation if the symptoms worsen or if the condition fails to improve as anticipated.     Loura Pardon, MD

## 2022-08-29 NOTE — Assessment & Plan Note (Signed)
Day 2 of sympt with pos home test  Was traveling in [place] last week   Px paxlovid-inst to call if side eff Tessalon for cough Antic guidance Disc sympt care Disc ER precautions   Update if not starting to improve in a week or if worsening    Inst to isolate until sympt are better/ 5 d min and then mask for 10 d

## 2022-09-09 ENCOUNTER — Ambulatory Visit
Admission: EM | Admit: 2022-09-09 | Discharge: 2022-09-09 | Disposition: A | Discharge status: Home / Self Care | Payer: PPO

## 2022-09-09 ENCOUNTER — Telehealth: Payer: Self-pay | Admitting: Family Medicine

## 2022-09-09 DIAGNOSIS — U071 COVID-19: Secondary | ICD-10-CM

## 2022-09-09 NOTE — ED Provider Notes (Signed)
Shari Gonzalez    CSN: [id number] Arrival date & time: 09/09/22  1552      History   Chief Complaint Chief Complaint  Patient presents with   Nasal Congestion   Cough    HPI Shari Gonzalez is a 71 y.o. female.  Patient presents with fever, congestion, postnasal drip, headache, cough since last night.  Treating symptoms with OTC cold and sinus medication.  Patient completed course of Paxlovid 1 week ago.  She denies chest pain, shortness of breath, vomiting, diarrhea, or other symptoms.  Patient had a video visit with her PCP on 08/29/2022; diagnosed with COVID; treated with Paxlovid and Tessalon [place].  She contacted her PCP today due to return of her symptoms and was instructed to come to urgent care.    The history is provided by the patient and medical records.    Past Medical History:  Diagnosis Date   Allergy    seasonal   Breast cancer ([place])    left    Cyst of left knee joint    Diarrhea    Heart murmur     Patient Active Problem List   Diagnosis Date Noted   Plantar fasciitis of left foot 05/20/2022   Left hip pain 04/22/2022   Medicare annual wellness visit, subsequent 10/13/2021   Colon cancer screening 10/13/2021   Accidental fall 09/07/2021   COVID-19 03/16/2021   Medicare annual wellness visit, initial 08/13/2019   Welcome to Medicare preventive visit 08/08/2018   Encounter for screening for HIV 08/08/2018   Encounter for hepatitis C screening test for low risk patient 08/08/2018   Routine general medical examination at a health care facility 07/09/2018   Allergic rhinitis 08/23/2016   Patellar tendinitis of left knee 03/08/2016   Perimeniscal cyst of left knee 11/10/2015   Breast cancer, left breast ([place]) 05/29/2014   History of breast cancer 04/14/2014   Bloating 04/08/2013    Past Surgical History:  Procedure Laterality Date   ABDOMINAL HYSTERECTOMY     complete with BSO   AUGMENTATION MAMMAPLASTY Bilateral    BREAST  RECONSTRUCTION WITH PLACEMENT OF TISSUE EXPANDER AND FLEX HD (ACELLULAR HYDRATED DERMIS)     COLONOSCOPY  2006   MASTECTOMY Left July 2012    mastectomy    OB History   No obstetric history on file.      Home Medications    Prior to Admission medications   Medication Sig Start Date End Date Taking? Authorizing Provider  benzonatate (TESSALON) 200 MG capsule Take 1 capsule (200 mg total) by mouth 3 (three) times daily as needed for cough. Swallow whole 08/29/22   Tower, Wynelle Fanny, MD  cholecalciferol (VITAMIN D) 1000 units tablet Take 2,000 Units by mouth daily.    [provider]  Estradiol 10 MCG TABS vaginal tablet Twice weekly 12/27/16   [provider]  loratadine (CLARITIN) 10 MG tablet Take 10 mg by mouth as needed.     [provider]  Magnesium Chloride (MAGNESIUM DR PO) Take 1 tablet by mouth daily.    [provider]  Multiple Vitamins-Minerals (MULTIVITAMIN WITH MINERALS) tablet Take 1 tablet by mouth daily.      [provider]  Probiotic Product (PROBIOTIC DAILY PO) Take by mouth.    [provider]  Pyridoxine HCl (VITAMIN B6 PO) Take 1 capsule by mouth daily.    [provider]  TURMERIC PO Take by mouth.    [provider]    Family History Family  History  Problem Relation Age of Onset   Alzheimer's disease Mother    Kidney cancer Father    Lung cancer Father    Gout Father    Diabetes Paternal Grandmother    Breast cancer Maternal Aunt    Liver cancer Paternal Grandfather    Colon cancer Neg Hx    Colon polyps Neg Hx    Esophageal cancer Neg Hx    Rectal cancer Neg Hx    Stomach cancer Neg Hx     Social History Social History   Tobacco Use   Smoking status: Never   Smokeless tobacco: Never  Substance Use Topics   Alcohol use: Yes    Alcohol/week: 2.0 standard drinks of alcohol    Types: 2 Glasses of wine per week    Comment: Occasional   Drug use: No     Allergies   Dilaudid  [hydromorphone hcl] and Guaifenesin er   Review of Systems Review of Systems  Constitutional:  Positive for fever. Negative for chills.  HENT:  Positive for congestion, postnasal drip and rhinorrhea. Negative for ear pain and sore throat.   Respiratory:  Positive for cough. Negative for shortness of breath.   Cardiovascular:  Negative for chest pain and palpitations.  Gastrointestinal:  Negative for diarrhea and vomiting.  Skin:  Negative for color change and rash.  All other systems reviewed and are negative.    Physical Exam Triage Vital Signs ED Triage Vitals  Enc Vitals Group     BP      Pulse      Resp      Temp      Temp src      SpO2      Weight      Height      Head Circumference      Peak Flow      Pain Score      Pain Loc      Pain Edu?      Excl. in [place]?    No data found.  Updated Vital Signs BP 134/78   Pulse 83   Temp 98.5 F (36.9 C)   Resp 18   Ht '5\' 4"'$  (1.626 m)   Wt 125 lb (56.7 kg)   SpO2 97%   BMI 21.46 kg/m   Visual Acuity Right Eye Distance:   Left Eye Distance:   Bilateral Distance:    Right Eye Near:   Left Eye Near:    Bilateral Near:     Physical Exam Vitals and nursing note reviewed.  Constitutional:      General: She is not in acute distress.    Appearance: Normal appearance. She is well-developed. She is not ill-appearing.  HENT:     Right Ear: Tympanic membrane normal.     Left Ear: Tympanic membrane normal.     Nose: Rhinorrhea present.     Mouth/Throat:     Mouth: Mucous membranes are moist.     Pharynx: Oropharynx is clear.  Cardiovascular:     Rate and Rhythm: Normal rate and regular rhythm.     Heart sounds: Normal heart sounds.  Pulmonary:     Effort: Pulmonary effort is normal. No respiratory distress.     Breath sounds: Normal breath sounds. No wheezing, rhonchi or rales.  Musculoskeletal:     Cervical back: Neck supple.  Skin:    General: Skin is warm and dry.  Neurological:     Mental Status: She is  alert.  Psychiatric:  Mood and Affect: Mood normal.        Behavior: Behavior normal.      UC Treatments / Results  Labs (all labs ordered are listed, but only abnormal results are displayed) Labs Reviewed - No data to display  EKG   Radiology No results found.  Procedures Procedures (including critical care time)  Medications Ordered in UC Medications - No data to display  Initial Impression / Assessment and Plan / UC Course  I have reviewed the triage vital signs and the nursing notes.  Pertinent labs & imaging results that were available during my care of the patient were reviewed by me and considered in my medical decision making (see chart for details).    COVID-19.  Patient completed Paxlovid 1 week ago.  Her symptoms are likely due to rebound COVID.  Vital signs are stable, lungs are clear, O2 sat 97% on room air.  No chest pain or shortness of breath.  Discussed symptomatic treatment.  Instructed patient to re-quarantine per CDC guidelines.  Education provided on COVID-19.  Instructed patient to follow up with her PCP if her symptoms are not improving.  She agrees to plan of care.    Final Clinical Impressions(s) / UC Diagnoses   Final diagnoses:  [id number]     Discharge Instructions      Follow up with your primary care provider if your symptoms are not improving.        ED Prescriptions   None    PDMP not reviewed this encounter.   Sharion Balloon, NP 09/09/22 [phone]

## 2022-09-09 NOTE — Telephone Encounter (Signed)
Pt had VV first of Dec and pt took Paxlovid and all symptoms went away by the time pt finished the paxlovid. Pt said last night she woke up and was wet with perspiration and pt said she was sure she had had a fever and the fever broke. Today pt does not think she has a fever.pt has head congestion,H/A,and dry cough. Pt does not feel good last night or today. No CP, SOB, or wheezing but pt is afraid has covid again. Pt has been taking Vicks severe sinus which has acetaminophen in it. Pt is going to go to [facility] for eval. Sending note to Dr Glori Bickers, [employer].

## 2022-09-09 NOTE — Telephone Encounter (Signed)
Please triage

## 2022-09-09 NOTE — ED Triage Notes (Signed)
Patient to Urgent Care with complaints of head and nasal congestion, headache, and dry cough. Believes she may have had a fever last night- reports that she woke up drenched in sweat. Symptoms started yesterday.   Reports taking Paxlovid after being diagnosed with covid 12/1.   Patient has been taking Vicks severe sinus w/ tylenol.

## 2022-09-09 NOTE — Telephone Encounter (Signed)
Pt called stating she saw Tower on 12/4 for covid & she tested pos for covid again today, 12/15. Pt is asking for advice on what to do? Pt is asking can more "Paxlovid" be prescribed or possibly something stronger? Pt is asking can meds be prescribed today so she can get through the weekend? Call back # [phone]

## 2022-09-09 NOTE — Telephone Encounter (Signed)
Aware, will watch for correspondence  

## 2022-09-09 NOTE — Discharge Instructions (Addendum)
Follow up with your primary care provider if your symptoms are not improving.     

## 2022-10-05 DIAGNOSIS — Z1231 Encounter for screening mammogram for malignant neoplasm of breast: Secondary | ICD-10-CM | POA: Diagnosis not present

## 2022-10-05 DIAGNOSIS — Z01419 Encounter for gynecological examination (general) (routine) without abnormal findings: Secondary | ICD-10-CM | POA: Diagnosis not present

## 2022-10-05 DIAGNOSIS — Z6822 Body mass index (BMI) 22.0-22.9, adult: Secondary | ICD-10-CM | POA: Diagnosis not present

## 2022-10-05 LAB — HM MAMMOGRAPHY

## 2022-10-11 ENCOUNTER — Encounter: Payer: Self-pay | Admitting: Family Medicine

## 2022-10-28 DIAGNOSIS — H2512 Age-related nuclear cataract, left eye: Secondary | ICD-10-CM | POA: Diagnosis not present

## 2022-10-28 DIAGNOSIS — H2511 Age-related nuclear cataract, right eye: Secondary | ICD-10-CM | POA: Diagnosis not present

## 2022-10-28 DIAGNOSIS — H43813 Vitreous degeneration, bilateral: Secondary | ICD-10-CM | POA: Diagnosis not present

## 2022-10-31 ENCOUNTER — Telehealth: Payer: Self-pay | Admitting: Family Medicine

## 2022-10-31 DIAGNOSIS — Z13220 Encounter for screening for lipoid disorders: Secondary | ICD-10-CM

## 2022-10-31 DIAGNOSIS — Z8709 Personal history of other diseases of the respiratory system: Secondary | ICD-10-CM | POA: Insufficient documentation

## 2022-10-31 DIAGNOSIS — Z0000 Encounter for general adult medical examination without abnormal findings: Secondary | ICD-10-CM

## 2022-10-31 DIAGNOSIS — Z853 Personal history of malignant neoplasm of breast: Secondary | ICD-10-CM

## 2022-10-31 NOTE — Telephone Encounter (Signed)
See prev note,Lab work is for CPE scheduled on 11/09/22

## 2022-10-31 NOTE — Telephone Encounter (Signed)
I put the orders in There is a cbc  Since she has medicare not all labs may be covered - she needs to know that I still think they need to be done

## 2022-10-31 NOTE — Telephone Encounter (Signed)
Pt notified orders are in and advised of Dr. Marliss Coots comments

## 2022-10-31 NOTE — Telephone Encounter (Signed)
Patient called in stating that she is having lab work done on Wednesday. She was wanting to know if she would be having a full work up. She was wanting to have her blood cell count checked due to been sick more often than normal since having Covid. Please advise.Thank you!

## 2022-10-31 NOTE — Addendum Note (Signed)
Addended by: Loura Pardon A on: 10/31/2022 11:28 AM   Modules accepted: Orders

## 2022-11-01 ENCOUNTER — Telehealth: Payer: Self-pay | Admitting: Family Medicine

## 2022-11-01 DIAGNOSIS — Z853 Personal history of malignant neoplasm of breast: Secondary | ICD-10-CM

## 2022-11-01 DIAGNOSIS — Z13220 Encounter for screening for lipoid disorders: Secondary | ICD-10-CM

## 2022-11-01 DIAGNOSIS — Z0000 Encounter for general adult medical examination without abnormal findings: Secondary | ICD-10-CM

## 2022-11-01 NOTE — Telephone Encounter (Signed)
-----   Message from Velna Hatchet, RT sent at 10/26/2022 10:18 AM EST ----- Regarding: Wed 2/7 lab Patient is scheduled for cpx, please order future labs.  Thanks, Anda Kraft

## 2022-11-02 ENCOUNTER — Other Ambulatory Visit (INDEPENDENT_AMBULATORY_CARE_PROVIDER_SITE_OTHER): Payer: PPO

## 2022-11-02 DIAGNOSIS — Z13220 Encounter for screening for lipoid disorders: Secondary | ICD-10-CM

## 2022-11-02 DIAGNOSIS — Z0000 Encounter for general adult medical examination without abnormal findings: Secondary | ICD-10-CM | POA: Diagnosis not present

## 2022-11-02 DIAGNOSIS — Z853 Personal history of malignant neoplasm of breast: Secondary | ICD-10-CM | POA: Diagnosis not present

## 2022-11-02 LAB — TSH: TSH: 6.25 u[IU]/mL — ABNORMAL HIGH (ref 0.35–5.50)

## 2022-11-02 LAB — COMPREHENSIVE METABOLIC PANEL
ALT: 13 U/L (ref 0–35)
AST: 18 U/L (ref 0–37)
Albumin: 4.1 g/dL (ref 3.5–5.2)
Alkaline Phosphatase: 60 U/L (ref 39–117)
BUN: 11 mg/dL (ref 6–23)
CO2: 29 mEq/L (ref 19–32)
Calcium: 8.8 mg/dL (ref 8.4–10.5)
Chloride: 105 mEq/L (ref 96–112)
Creatinine, Ser: 0.7 mg/dL (ref 0.40–1.20)
GFR: 87.81 mL/min (ref >60.00)
Glucose, Bld: 90 mg/dL (ref 70–99)
Potassium: 4 mEq/L (ref 3.5–5.1)
Sodium: 143 mEq/L (ref 135–145)
Total Bilirubin: 0.3 mg/dL (ref 0.2–1.2)
Total Protein: 6.1 g/dL (ref 6.0–8.3)

## 2022-11-02 LAB — CBC WITH DIFFERENTIAL/PLATELET
Basophils Absolute: 0 10*3/uL (ref 0.0–0.1)
Basophils Relative: 0.5 % (ref 0.0–3.0)
Eosinophils Absolute: 0.1 10*3/uL (ref 0.0–0.7)
Eosinophils Relative: 1.9 % (ref 0.0–5.0)
HCT: 39.2 % (ref 36.0–46.0)
Hemoglobin: 13.4 g/dL (ref 12.0–15.0)
Lymphocytes Relative: 28.8 % (ref 12.0–46.0)
Lymphs Abs: 1 10*3/uL (ref 0.7–4.0)
MCHC: 34.2 g/dL (ref 30.0–36.0)
MCV: 90.6 fl (ref 78.0–100.0)
Monocytes Absolute: 0.4 10*3/uL (ref 0.1–1.0)
Monocytes Relative: 11.7 % (ref 3.0–12.0)
Neutro Abs: 1.9 10*3/uL (ref 1.4–7.7)
Neutrophils Relative %: 57.1 % (ref 43.0–77.0)
Platelets: 197 10*3/uL (ref 150.0–400.0)
RBC: 4.33 Mil/uL (ref 3.87–5.11)
RDW: 13.4 % (ref 11.5–15.5)
WBC: 3.3 10*3/uL — ABNORMAL LOW (ref 4.0–10.5)

## 2022-11-02 LAB — LIPID PANEL
Cholesterol: 134 mg/dL (ref 0–200)
HDL: 44.5 mg/dL (ref >39.00)
LDL Cholesterol: 75 mg/dL (ref 0–99)
NonHDL: 89.32
Total CHOL/HDL Ratio: 3
Triglycerides: 71 mg/dL (ref 0.0–149.0)
VLDL: 14.2 mg/dL (ref 0.0–40.0)

## 2022-11-09 ENCOUNTER — Ambulatory Visit (INDEPENDENT_AMBULATORY_CARE_PROVIDER_SITE_OTHER): Payer: PPO | Admitting: Family Medicine

## 2022-11-09 ENCOUNTER — Encounter: Payer: Self-pay | Admitting: Family Medicine

## 2022-11-09 VITALS — BP 130/76 | HR 49 | Temp 97.5°F | Ht 62.75 in | Wt 124.0 lb

## 2022-11-09 DIAGNOSIS — Z0000 Encounter for general adult medical examination without abnormal findings: Secondary | ICD-10-CM | POA: Diagnosis not present

## 2022-11-09 DIAGNOSIS — Z1211 Encounter for screening for malignant neoplasm of colon: Secondary | ICD-10-CM | POA: Diagnosis not present

## 2022-11-09 DIAGNOSIS — R7989 Other specified abnormal findings of blood chemistry: Secondary | ICD-10-CM | POA: Diagnosis not present

## 2022-11-09 DIAGNOSIS — Z09 Encounter for follow-up examination after completed treatment for conditions other than malignant neoplasm: Secondary | ICD-10-CM | POA: Diagnosis not present

## 2022-11-09 DIAGNOSIS — Z13220 Encounter for screening for lipoid disorders: Secondary | ICD-10-CM

## 2022-11-09 DIAGNOSIS — D225 Melanocytic nevi of trunk: Secondary | ICD-10-CM | POA: Diagnosis not present

## 2022-11-09 DIAGNOSIS — L570 Actinic keratosis: Secondary | ICD-10-CM | POA: Diagnosis not present

## 2022-11-09 DIAGNOSIS — L02426 Furuncle of left lower limb: Secondary | ICD-10-CM | POA: Diagnosis not present

## 2022-11-09 DIAGNOSIS — X32XXXA Exposure to sunlight, initial encounter: Secondary | ICD-10-CM | POA: Diagnosis not present

## 2022-11-09 DIAGNOSIS — L309 Dermatitis, unspecified: Secondary | ICD-10-CM | POA: Diagnosis not present

## 2022-11-09 DIAGNOSIS — L814 Other melanin hyperpigmentation: Secondary | ICD-10-CM | POA: Diagnosis not present

## 2022-11-09 DIAGNOSIS — L821 Other seborrheic keratosis: Secondary | ICD-10-CM | POA: Diagnosis not present

## 2022-11-09 NOTE — Assessment & Plan Note (Signed)
Lipids are fairly stable Disc goals for lipids and reasons to control them Rev last labs with pt Rev low sat fat diet in detail HDL is down but LDL also down

## 2022-11-09 NOTE — Patient Instructions (Addendum)
If you are interested in the new shingles vaccine (Shingrix) - call your local pharmacy to check on coverage and availability  If affordable, get on a wait list at your pharmacy to get the vaccine.  Additional thyroid lab today   If you want to get up to date on tetanus shot -get that at the pharmacy   Make sure you are getting protein with every meal  Meat /fish/nuts/eggs/dairy/dried beans/soy   Take care of yourself  Take it easy on yourself getting back to exercise  This is gradual

## 2022-11-09 NOTE — Assessment & Plan Note (Signed)
Reviewed health habits including diet and exercise and skin cancer prevention Reviewed appropriate screening tests for age  Also reviewed health mt list, fam hx and immunization status , as well as social and family history   See HPI Labs reviewed  Missed flu shot this season eue to being sick  Discussed shingrix/ declines for now but may consider later  Due for tetanus-inst pt to consider getting at pharmacy Mammogram utd 09/2022 Cologuard negative 10/2021 [facility] Utd dexa- sent for this report from gyn office  Good exercise habits/ enc her to keep working on strength training

## 2022-11-09 NOTE — Assessment & Plan Note (Signed)
Negative cologuard 10/2021  This is good for 3 years  Declines colonoscopy unless this comes back positive in future

## 2022-11-09 NOTE — Progress Notes (Signed)
Subjective:    Patient ID: Shari Gonzalez, female    DOB: [birth year], 72 y.o.   MRN: [id number]  HPI Here for health maintenance exam and to review chronic medical problems   Wt Readings from Last 3 Encounters:  11/09/22 124 lb (56.2 kg)  09/09/22 125 lb (56.7 kg)  05/19/22 128 lb (58.1 kg)   22.14 kg/m  Vitals:   11/09/22 1406  BP: 130/76  Pulse: (!) 49  Temp: (!) 97.5 F (36.4 C)  SpO2: (!) 10%     Immunization History  Administered Date(s) Administered   Fluad Quad(high Dose 65+) 08/13/2019, 08/19/2020, 10/13/2021   Influenza,inj,Quad PF,6+ Mos 08/08/2018   PFIZER(Purple Top)SARS-COV-2 Vaccination 10/18/2019, 11/08/2019   Pneumococcal Conjugate-13 08/08/2018   Pneumococcal Polysaccharide-23 08/13/2019   Health Maintenance Due  Topic Date Due   DTaP/Tdap/Td (1 - Tdap) Never done   Zoster Vaccines- Shingrix (1 of 2) Never done   INFLUENZA VACCINE  04/26/2022   Medicare Annual Wellness (AWV)  10/13/2022   Had covid in dec Then a stomach bug Caught something from grand kids  Just cannot see well   Missed flu shot-was too sick    She is unsure if she wants any more covid imms   Shingrix: has has shinglex twice already  Is afraid of side eff of the vaccine   Flu shot -missed   Tetanus shot- unsure if interested    Mammogram 09/2022 Personal h/o breast cancer  Self breast exam: nothing new/no lumps   Colonoscopy 10/2015- does not want to schedule  / does not tol prep well  Ordered cologuard a year ago - neg 10/2021  She does take probiotics   Dexa  02/2014 - gyn provider (thinks she is due this year)  Sees gyn annually  Falls: none Fractures:none  Supplements vit D and mvi  Exercise : (when not sick)   spins and does boot camp   Dermatology care  Had visit this am -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  Watching some spots on her legs  Very good about sunscreen   Lipid screening   Lab Results  Component Value Date   CHOL 134 11/02/2022    CHOL 180 10/13/2021   CHOL 160 08/13/2020   Lab Results  Component Value Date   HDL 44.50 11/02/2022   HDL 67.60 10/13/2021   HDL 62.20 08/13/2020   Lab Results  Component Value Date   LDLCALC 75 11/02/2022   LDLCALC 102 (H) 10/13/2021   LDLCALC 85 08/13/2020   Lab Results  Component Value Date   TRIG 71.0 11/02/2022   TRIG 53.0 10/13/2021   TRIG 64.0 08/13/2020   Lab Results  Component Value Date   CHOLHDL 3 11/02/2022   CHOLHDL 3 10/13/2021   CHOLHDL 3 08/13/2020   No results found for: "LDLDIRECT"  Both HDL and LDL went down  Does eat salmon and other fish   Other labs Lab Results  Component Value Date   CREATININE 0.70 11/02/2022   BUN 11 11/02/2022   NA 143 11/02/2022   K 4.0 11/02/2022   CL 105 11/02/2022   CO2 29 11/02/2022   Glucose 90 Lab Results  Component Value Date   WBC 3.3 (L) 11/02/2022   HGB 13.4 11/02/2022   HCT 39.2 11/02/2022   MCV 90.6 11/02/2022   PLT 197.0 11/02/2022   Lab Results  Component Value Date   TSH 6.25 (H) 11/02/2022    Had a h/o hypoglycemia in the past  Patient Active Problem List   Diagnosis Date Noted   Elevated TSH 11/09/2022   History of frequent URI 10/31/2022   Lipid screening 10/31/2022   Plantar fasciitis of left foot 05/20/2022   Left hip pain 04/22/2022   Medicare annual wellness visit, subsequent 10/13/2021   Colon cancer screening 10/13/2021   Medicare annual wellness visit, initial 08/13/2019   Welcome to Medicare preventive visit 08/08/2018   Routine general medical examination at a health care facility 07/09/2018   Allergic rhinitis 08/23/2016   Patellar tendinitis of left knee 03/08/2016   Perimeniscal cyst of left knee 11/10/2015   Breast cancer, left breast ([place]) 05/29/2014   History of breast cancer 04/14/2014   Bloating 04/08/2013   Past Medical History:  Diagnosis Date   Allergy    seasonal   Breast cancer ([place])    left    Cyst of left knee joint    Diarrhea    Heart murmur     Past Surgical History:  Procedure Laterality Date   ABDOMINAL HYSTERECTOMY     complete with BSO   AUGMENTATION MAMMAPLASTY Bilateral    BREAST RECONSTRUCTION WITH PLACEMENT OF TISSUE EXPANDER AND FLEX HD (ACELLULAR HYDRATED DERMIS)     COLONOSCOPY  2006   MASTECTOMY Left July 2012    mastectomy   Social History   Tobacco Use   Smoking status: Never   Smokeless tobacco: Never  Substance Use Topics   Alcohol use: Yes    Alcohol/week: 2.0 standard drinks of alcohol    Types: 2 Glasses of wine per week    Comment: Occasional   Drug use: No   Family History  Problem Relation Age of Onset   Alzheimer's disease Mother    Kidney cancer Father    Lung cancer Father    Gout Father    Diabetes Paternal Grandmother    Breast cancer Maternal Aunt    Liver cancer Paternal Grandfather    Colon cancer Neg Hx    Colon polyps Neg Hx    Esophageal cancer Neg Hx    Rectal cancer Neg Hx    Stomach cancer Neg Hx    Allergies  Allergen Reactions   Ambrosia Artemisiifolia (Ragweed) Skin Test Other (See Comments)   Dilaudid [Hydromorphone Hcl] Rash   Guaifenesin Er Rash   Current Outpatient Medications on File Prior to Visit  Medication Sig Dispense Refill   cholecalciferol (VITAMIN D) 1000 units tablet Take 2,000 Units by mouth daily.     Estradiol 10 MCG TABS vaginal tablet Twice weekly     loratadine (CLARITIN) 10 MG tablet Take 10 mg by mouth as needed.      Magnesium Chloride (MAGNESIUM DR PO) Take 1 tablet by mouth daily.     Multiple Vitamins-Minerals (MULTIVITAMIN WITH MINERALS) tablet Take 1 tablet by mouth daily.       Probiotic Product (PROBIOTIC DAILY PO) Take by mouth.     Pyridoxine HCl (VITAMIN B6 PO) Take 1 capsule by mouth daily.     TURMERIC PO Take by mouth.     No current facility-administered medications on file prior to visit.    Review of Systems  Constitutional:  Negative for activity change, appetite change, fatigue, fever and unexpected weight change.   HENT:  Negative for congestion, ear pain, rhinorrhea, sinus pressure and sore throat.        A lot of viral uri illnesses lately  Eyes:  Negative for pain, redness and visual disturbance.  Respiratory:  Negative for cough,  shortness of breath and wheezing.   Cardiovascular:  Negative for chest pain and palpitations.  Gastrointestinal:  Negative for abdominal pain, blood in stool, constipation and diarrhea.       Takes probiotics  Endocrine: Negative for polydipsia and polyuria.  Genitourinary:  Negative for dysuria, frequency and urgency.  Musculoskeletal:  Negative for arthralgias, back pain and myalgias.  Skin:  Negative for pallor and rash.       Being treated for sun damage/AKs on her chest   Allergic/Immunologic: Negative for environmental allergies.  Neurological:  Negative for dizziness, syncope and headaches.  Hematological:  Negative for adenopathy. Does not bruise/bleed easily.  Psychiatric/Behavioral:  Negative for decreased concentration and dysphoric mood. The patient is not nervous/anxious.        Objective:   Physical Exam Constitutional:      General: She is not in acute distress.    Appearance: Normal appearance. She is well-developed and normal weight. She is not ill-appearing or diaphoretic.  HENT:     Head: Normocephalic and atraumatic.     Right Ear: Tympanic membrane, ear canal and external ear normal.     Left Ear: Tympanic membrane, ear canal and external ear normal.     Nose: Nose normal. No congestion.     Mouth/Throat:     Mouth: Mucous membranes are moist.     Pharynx: Oropharynx is clear. No posterior oropharyngeal erythema.  Eyes:     General: No scleral icterus.    Extraocular Movements: Extraocular movements intact.     Conjunctiva/sclera: Conjunctivae normal.     Pupils: Pupils are equal, round, and reactive to light.  Neck:     Thyroid: No thyromegaly.     Vascular: No carotid bruit or JVD.  Cardiovascular:     Rate and Rhythm: Regular  rhythm. Bradycardia present.     Pulses: Normal pulses.     Heart sounds: Normal heart sounds.     No gallop.  Pulmonary:     Effort: Pulmonary effort is normal. No respiratory distress.     Breath sounds: Normal breath sounds. No wheezing.     Comments: Good air exch Chest:     Chest wall: No tenderness.  Abdominal:     General: Bowel sounds are normal. There is no distension or abdominal bruit.     Palpations: Abdomen is soft. There is no mass.     Tenderness: There is no abdominal tenderness.     Hernia: No hernia is present.  Genitourinary:    Comments: Breast and pelvic exams are done by gyn provider  Musculoskeletal:        General: No tenderness. Normal range of motion.     Cervical back: Normal range of motion and neck supple. No rigidity. No muscular tenderness.     Right lower leg: No edema.     Left lower leg: No edema.     Comments: No kyphosis   Lymphadenopathy:     Cervical: No cervical adenopathy.  Skin:    General: Skin is warm and dry.     Coloration: Skin is not pale.     Findings: No erythema or rash.     Comments: Solar lentigines diffusely Solar aging Some AKs and SKs  Neurological:     Mental Status: She is alert. Mental status is at baseline.     Cranial Nerves: No cranial nerve deficit.     Motor: No abnormal muscle tone.     Coordination: Coordination normal.     Gait:  Gait normal.     Deep Tendon Reflexes: Reflexes are normal and symmetric. Reflexes normal.  Psychiatric:        Mood and Affect: Mood normal.        Cognition and Memory: Cognition and memory normal.           Assessment & Plan:   Problem List Items Addressed This Visit       Other   Colon cancer screening    Negative cologuard 10/2021  This is good for 3 years  Declines colonoscopy unless this comes back positive in future       Elevated TSH    Lab Results  Component Value Date   TSH 6.25 (H) 11/02/2022  This is new Some fatigue FT4 ordered today       Relevant Orders   T4, free   Lipid screening    Lipids are fairly stable Disc goals for lipids and reasons to control them Rev last labs with pt Rev low sat fat diet in detail HDL is down but LDL also down        Routine general medical examination at a health care facility - Primary    Reviewed health habits including diet and exercise and skin cancer prevention Reviewed appropriate screening tests for age  Also reviewed health mt list, fam hx and immunization status , as well as social and family history   See HPI Labs reviewed  Missed flu shot this season eue to being sick  Discussed shingrix/ declines for now but may consider later  Due for tetanus-inst pt to consider getting at pharmacy Mammogram utd 09/2022 Cologuard negative 10/2021 [facility] derm [facility] Utd dexa- sent for this report from gyn office  Good exercise habits/ enc her to keep working on strength training

## 2022-11-09 NOTE — Assessment & Plan Note (Signed)
Lab Results  Component Value Date   TSH 6.25 (H) 11/02/2022   This is new Some fatigue FT4 ordered today

## 2022-11-10 LAB — T4, FREE: Free T4: 0.9 ng/dL (ref 0.60–1.60)

## 2022-11-16 ENCOUNTER — Ambulatory Visit (INDEPENDENT_AMBULATORY_CARE_PROVIDER_SITE_OTHER): Payer: PPO

## 2022-11-16 VITALS — Ht 63.5 in | Wt 124.0 lb

## 2022-11-16 DIAGNOSIS — Z0000 Encounter for general adult medical examination without abnormal findings: Secondary | ICD-10-CM | POA: Diagnosis not present

## 2022-11-16 NOTE — Patient Instructions (Signed)
Shari Gonzalez , Thank you for taking time to come for your Medicare Wellness Visit. I appreciate your ongoing commitment to your health goals. Please review the following plan we discussed and let me know if I can assist you in the future.   These are the goals we discussed:  Goals   None     This is a list of the screening recommended for you and due dates:  Health Maintenance  Topic Date Due   DTaP/Tdap/Td vaccine (1 - Tdap) Never done   Flu Shot  12/25/2022*   Zoster (Shingles) Vaccine (1 of 2) 02/07/2023*   COVID-19 Vaccine (3 - Pfizer risk series) 10/29/2024*   Mammogram  10/06/2023   Medicare Annual Wellness Visit  11/17/2023   Colon Cancer Screening  11/12/2025   Pneumonia Vaccine  Completed   DEXA scan (bone density measurement)  Completed   Hepatitis C Screening: USPSTF Recommendation to screen - Ages 40-79 yo.  Completed   HPV Vaccine  Aged Out  *Topic was postponed. The date shown is not the original due date.    Advanced directives: yes  Conditions/risks identified: none  Next appointment: Follow up in one year for your annual wellness visit 11/22/2023 @2$ :30pm   Preventive Care 65 Years and Older, Female Preventive care refers to lifestyle choices and visits with your health care provider that can promote health and wellness. What does preventive care include? A yearly physical exam. This is also called an annual well check. Dental exams once or twice a year. Routine eye exams. Ask your health care provider how often you should have your eyes checked. Personal lifestyle choices, including: Daily care of your teeth and gums. Regular physical activity. Eating a healthy diet. Avoiding tobacco and drug use. Limiting alcohol use. Practicing safe sex. Taking low-dose aspirin every day. Taking vitamin and mineral supplements as recommended by your health care provider. What happens during an annual well check? The services and screenings done by your health care  provider during your annual well check will depend on your age, overall health, lifestyle risk factors, and family history of disease. Counseling  Your health care provider may ask you questions about your: Alcohol use. Tobacco use. Drug use. Emotional well-being. Home and relationship well-being. Sexual activity. Eating habits. History of falls. Memory and ability to understand (cognition). Work and work Statistician. Reproductive health. Screening  You may have the following tests or measurements: Height, weight, and BMI. Blood pressure. Lipid and cholesterol levels. These may be checked every 5 years, or more frequently if you are over 26 years old. Skin check. Lung cancer screening. You may have this screening every year starting at age 45 if you have a 30-pack-year history of smoking and currently smoke or have quit within the past 15 years. Fecal occult blood test (FOBT) of the stool. You may have this test every year starting at age 6. Flexible sigmoidoscopy or colonoscopy. You may have a sigmoidoscopy every 5 years or a colonoscopy every 10 years starting at age 70. Hepatitis C blood test. Hepatitis B blood test. Sexually transmitted disease (STD) testing. Diabetes screening. This is done by checking your blood sugar (glucose) after you have not eaten for a while (fasting). You may have this done every 1-3 years. Bone density scan. This is done to screen for osteoporosis. You may have this done starting at age 82. Mammogram. This may be done every 1-2 years. Talk to your health care provider about how often you should have regular mammograms. Talk  with your health care provider about your test results, treatment options, and if necessary, the need for more tests. Vaccines  Your health care provider may recommend certain vaccines, such as: Influenza vaccine. This is recommended every year. Tetanus, diphtheria, and acellular pertussis (Tdap, Td) vaccine. You may need a Td  booster every 10 years. Zoster vaccine. You may need this after age 38. Pneumococcal 13-valent conjugate (PCV13) vaccine. One dose is recommended after age 41. Pneumococcal polysaccharide (PPSV23) vaccine. One dose is recommended after age 32. Talk to your health care provider about which screenings and vaccines you need and how often you need them. This information is not intended to replace advice given to you by your health care provider. Make sure you discuss any questions you have with your health care provider. Document Released: 10/09/2015 Document Revised: 06/01/2016 Document Reviewed: 07/14/2015 Elsevier Interactive Patient Education  2017 [employer] Prevention in the Home Falls can cause injuries. They can happen to people of all ages. There are many things you can do to make your home safe and to help prevent falls. What can I do on the outside of my home? Regularly fix the edges of walkways and driveways and fix any cracks. Remove anything that might make you trip as you walk through a door, such as a raised step or threshold. Trim any bushes or trees on the path to your home. Use bright outdoor lighting. Clear any walking paths of anything that might make someone trip, such as rocks or tools. Regularly check to see if handrails are loose or broken. Make sure that both sides of any steps have handrails. Any raised decks and porches should have guardrails on the edges. Have any leaves, snow, or ice cleared regularly. Use sand or salt on walking paths during winter. Clean up any spills in your garage right away. This includes oil or grease spills. What can I do in the bathroom? Use night lights. Install grab bars by the toilet and in the tub and shower. Do not use towel bars as grab bars. Use non-skid mats or decals in the tub or shower. If you need to sit down in the shower, use a plastic, non-slip stool. Keep the floor dry. Clean up any water that spills on the floor  as soon as it happens. Remove soap buildup in the tub or shower regularly. Attach bath mats securely with double-sided non-slip rug tape. Do not have throw rugs and other things on the floor that can make you trip. What can I do in the bedroom? Use night lights. Make sure that you have a light by your bed that is easy to reach. Do not use any sheets or blankets that are too big for your bed. They should not hang down onto the floor. Have a firm chair that has side arms. You can use this for support while you get dressed. Do not have throw rugs and other things on the floor that can make you trip. What can I do in the kitchen? Clean up any spills right away. Avoid walking on wet floors. Keep items that you use a lot in easy-to-reach places. If you need to reach something above you, use a strong step stool that has a grab bar. Keep electrical cords out of the way. Do not use floor polish or wax that makes floors slippery. If you must use wax, use non-skid floor wax. Do not have throw rugs and other things on the floor that can make you  trip. What can I do with my stairs? Do not leave any items on the stairs. Make sure that there are handrails on both sides of the stairs and use them. Fix handrails that are broken or loose. Make sure that handrails are as long as the stairways. Check any carpeting to make sure that it is firmly attached to the stairs. Fix any carpet that is loose or worn. Avoid having throw rugs at the top or bottom of the stairs. If you do have throw rugs, attach them to the floor with carpet tape. Make sure that you have a light switch at the top of the stairs and the bottom of the stairs. If you do not have them, ask someone to add them for you. What else can I do to help prevent falls? Wear shoes that: Do not have high heels. Have rubber bottoms. Are comfortable and fit you well. Are closed at the toe. Do not wear sandals. If you use a stepladder: Make sure that it is  fully opened. Do not climb a closed stepladder. Make sure that both sides of the stepladder are locked into place. Ask someone to hold it for you, if possible. Clearly mark and make sure that you can see: Any grab bars or handrails. First and last steps. Where the edge of each step is. Use tools that help you move around (mobility aids) if they are needed. These include: Canes. Walkers. Scooters. Crutches. Turn on the lights when you go into a dark area. Replace any light bulbs as soon as they burn out. Set up your furniture so you have a clear path. Avoid moving your furniture around. If any of your floors are uneven, fix them. If there are any pets around you, be aware of where they are. Review your medicines with your doctor. Some medicines can make you feel dizzy. This can increase your chance of falling. Ask your doctor what other things that you can do to help prevent falls. This information is not intended to replace advice given to you by your health care provider. Make sure you discuss any questions you have with your health care provider. Document Released: 07/09/2009 Document Revised: 02/18/2016 Document Reviewed: 10/17/2014 Elsevier Interactive Patient Education  2017 [employer].

## 2022-11-16 NOTE — Progress Notes (Signed)
I connected with  Shari Gonzalez on 11/16/22 by a audio enabled telemedicine application and verified that I am speaking with the correct person using two identifiers.  Patient Location: Home  Provider Location: Office/Clinic  I discussed the limitations of evaluation and management by telemedicine. The patient expressed understanding and agreed to proceed.  Subjective:   Shari Gonzalez is a 72 y.o. female who presents for Medicare Annual (Subsequent) preventive examination.  Review of Systems           Objective:    Today's Vitals   11/16/22 1357  Weight: 124 lb (56.2 kg)  Height: 5' 3.5" (1.613 m)   Body mass index is 21.62 kg/m.     09/09/2022    4:08 PM 09/09/2022    4:03 PM 11/13/2015    2:20 PM 09/29/2015   11:05 AM 04/06/2015    2:45 PM  Advanced Directives  Does Patient Have a Medical Advance Directive? Yes No No No No  Type of Paramedic of [place];Living will        Current Medications (verified) Outpatient Encounter Medications as of 11/16/2022  Medication Sig   cholecalciferol (VITAMIN D) 1000 units tablet Take 2,000 Units by mouth daily.   Estradiol 10 MCG TABS vaginal tablet Twice weekly   loratadine (CLARITIN) 10 MG tablet Take 10 mg by mouth as needed.    Magnesium Chloride (MAGNESIUM DR PO) Take 1 tablet by mouth daily.   Multiple Vitamins-Minerals (MULTIVITAMIN WITH MINERALS) tablet Take 1 tablet by mouth daily.     Probiotic Product (PROBIOTIC DAILY PO) Take by mouth.   Pyridoxine HCl (VITAMIN B6 PO) Take 1 capsule by mouth daily.   TURMERIC PO Take by mouth.   No facility-administered encounter medications on file as of 11/16/2022.    Allergies (verified) Ambrosia artemisiifolia (ragweed) skin test, Dilaudid [hydromorphone hcl], and Guaifenesin er   History: Past Medical History:  Diagnosis Date   Allergy    seasonal   Breast cancer ([place])    left    Cyst of left knee joint    Diarrhea    Heart murmur     Past Surgical History:  Procedure Laterality Date   ABDOMINAL HYSTERECTOMY     complete with BSO   AUGMENTATION MAMMAPLASTY Bilateral    BREAST RECONSTRUCTION WITH PLACEMENT OF TISSUE EXPANDER AND FLEX HD (ACELLULAR HYDRATED DERMIS)     COLONOSCOPY  2006   MASTECTOMY Left July 2012    mastectomy   Family History  Problem Relation Age of Onset   Alzheimer's disease Mother    Kidney cancer Father    Lung cancer Father    Gout Father    Diabetes Paternal Grandmother    Breast cancer Maternal Aunt    Liver cancer Paternal Grandfather    Colon cancer Neg Hx    Colon polyps Neg Hx    Esophageal cancer Neg Hx    Rectal cancer Neg Hx    Stomach cancer Neg Hx    Social History   Socioeconomic History   Marital status: Married    Spouse name: Not on file   Number of children: 2   Years of education: Not on file   Highest education level: Not on file  Occupational History    Employer: [employer]  Tobacco Use   Smoking status: Never   Smokeless tobacco: Never  Substance and Sexual Activity   Alcohol use: Yes    Alcohol/week: 2.0 standard drinks of alcohol  Types: 2 Glasses of wine per week    Comment: Occasional   Drug use: No   Sexual activity: Yes    Birth control/protection: None  Other Topics Concern   Not on file  Social History Narrative   Not on file   Social Determinants of Health   Financial Resource Strain: Low Risk  (11/16/2022)   Overall Financial Resource Strain (CARDIA)    Difficulty of Paying Living Expenses: Not hard at all  Food Insecurity: No Food Insecurity (11/16/2022)   Hunger Vital Sign    Worried About Running Out of Food in the Last Year: Never true    Ran Out of Food in the Last Year: Never true  Transportation Needs: No Transportation Needs (11/16/2022)   PRAPARE - Hydrologist (Medical): No    Lack of Transportation (Non-Medical): No  Physical Activity: Sufficiently Active (11/16/2022)   Exercise  Vital Sign    Days of Exercise per Week: 5 days    Minutes of Exercise per Session: 60 min  Stress: No Stress Concern Present (11/16/2022)   [place]    Feeling of Stress : Not at all  Social Connections: Moderately Integrated (11/16/2022)   Social Connection and Isolation Panel [NHANES]    Frequency of Communication with Friends and Family: More than three times a week    Frequency of Social Gatherings with Friends and Family: More than three times a week    Attends Religious Services: More than 4 times per year    Active Member of [employer] or Organizations: No    Attends Music therapist: Never    Marital Status: Married    Tobacco Counseling Counseling given: Not Answered   Clinical Intake:  Pre-visit preparation completed: Yes  Pain : No/denies pain     BMI - recorded: 21.62 Nutritional Status: BMI of 19-24  Normal Nutritional Risks: None Diabetes: No  How often do you need to have someone help you when you read instructions, pamphlets, or other written materials from your doctor or pharmacy?: 1 - Never  Diabetic?no  Interpreter Needed?: No  Information entered by :: B.Idrees Quam,LPN   Activities of Daily Living     No data to display          Patient Care Team: Tower, Wynelle Fanny, MD as PCP - General Eston Esters, MD (Inactive) as Consulting Physician (Hematology and Oncology) Crissie Reese, MD as Consulting Physician (Plastic Surgery)  Indicate any recent Medical Services you may have received from other than [facility] providers in the past year (date may be approximate).     Assessment:   This is a routine wellness examination for Shari Gonzalez.  Hearing/Vision screen Hearing Screening - Comments:: Adequate hearing Vision Screening - Comments:: Adequate vision w/glasses. Dr Festus Aloe  Dietary issues and exercise activities discussed:     Goals Addressed   None    Depression  Screen    11/09/2022    2:59 PM 10/13/2021   11:02 AM 08/19/2020    3:22 PM 08/13/2019    2:36 PM 08/08/2018    2:35 PM  PHQ 2/9 Scores  PHQ - 2 Score 0 0 0 0 0  PHQ- 9 Score 0        Fall Risk    11/16/2022    2:00 PM 11/09/2022    2:59 PM 10/13/2021   11:02 AM 08/13/2019    2:36 PM 08/08/2018    2:35 PM  Fall  Risk   Falls in the past year? 0 0 1 0 0  Number falls in past yr: 0 0 0    Injury with Fall? 0 0 0    Risk for fall due to : No Fall Risks No Fall Risks     Follow up Education provided;Falls prevention discussed Falls evaluation completed Falls evaluation completed Falls evaluation completed     FALL RISK PREVENTION PERTAINING TO THE HOME:  Any stairs in or around the home? Yes  If so, are there any without handrails? Yes  Home free of loose throw rugs in walkways, pet beds, electrical cords, etc? Yes  Adequate lighting in your home to reduce risk of falls? Yes   ASSISTIVE DEVICES UTILIZED TO PREVENT FALLS:  Life alert? No  Use of a cane, walker or w/c? No  Grab bars in the bathroom? No  Shower chair or bench in shower? No  Elevated toilet seat or a handicapped toilet? No    Cognitive Function:        11/16/2022    2:06 PM  6CIT Screen  What Year? 0 points  What month? 0 points  What time? 0 points  Count back from 20 0 points  Months in reverse 0 points  Repeat phrase 0 points  Total Score 0 points    Immunizations Immunization History  Administered Date(s) Administered   Fluad Quad(high Dose 65+) 08/13/2019, 08/19/2020, 10/13/2021   Influenza,inj,Quad PF,6+ Mos 08/08/2018   PFIZER(Purple Top)SARS-COV-2 Vaccination 10/18/2019, 11/08/2019   Pneumococcal Conjugate-13 08/08/2018   Pneumococcal Polysaccharide-23 08/13/2019    TDAP status: Due, Education has been provided regarding the importance of this vaccine. Advised may receive this vaccine at local pharmacy or Health Dept. Aware to provide a copy of the vaccination record if obtained from  local pharmacy or Health Dept. Verbalized acceptance and understanding.  Flu Vaccine status: Up to date  Pneumococcal vaccine status: Up to date  Covid-19 vaccine status: Completed vaccines  Qualifies for Shingles Vaccine? Yes   Zostavax completed No   Shingrix Completed?: No.    Education has been provided regarding the importance of this vaccine. Patient has been advised to call insurance company to determine out of pocket expense if they have not yet received this vaccine. Advised may also receive vaccine at local pharmacy or Health Dept. Verbalized acceptance and understanding.  Screening Tests Health Maintenance  Topic Date Due   DTaP/Tdap/Td (1 - Tdap) Never done   INFLUENZA VACCINE  12/25/2022 (Originally 04/26/2022)   Zoster Vaccines- Shingrix (1 of 2) 02/07/2023 (Originally 09/22/1970)   COVID-19 Vaccine (3 - Pfizer risk series) 10/29/2024 (Originally 12/06/2019)   MAMMOGRAM  10/06/2023   Medicare Annual Wellness (AWV)  11/17/2023   COLONOSCOPY (Pts 45-32yr Insurance coverage will need to be confirmed)  11/12/2025   Pneumonia Vaccine 72 Years old  Completed   DEXA SCAN  Completed   Hepatitis C Screening  Completed   HPV VACCINES  Aged Out    Health Maintenance  Health Maintenance Due  Topic Date Due   DTaP/Tdap/Td (1 - Tdap) Never done    Colorectal cancer screening: Type of screening: Colonoscopy. Completed yes. Repeat every 10 years  Mammogram status: Completed yes. Repeat every year  Bone Density status: Completed yes. Results reflect: Bone density results: NORMAL. Repeat every 10 years.  Lung Cancer Screening: (Low Dose CT Chest recommended if Age 71-80years, 30 pack-year currently smoking OR have quit w/in 15years.) does not qualify.   Lung Cancer Screening Referral: no  Additional Screening:  Hepatitis C Screening: does not qualify; Completed yes  Vision Screening: Recommended annual ophthalmology exams for early detection of glaucoma and other  disorders of the eye. Is the patient up to date with their annual eye exam?  Yes  Who is the provider or what is the name of the office in which the patient attends annual eye exams? [facility] If pt is not established with a provider, would they like to be referred to a provider to establish care? No .   Dental Screening: Recommended annual dental exams for proper oral hygiene  Community Resource Referral / Chronic Care Management: CRR required this visit?  No   CCM required this visit?  No      Plan:     I have personally reviewed and noted the following in the patient's chart:   Medical and social history Use of alcohol, tobacco or illicit drugs  Current medications and supplements including opioid prescriptions. Patient is not currently taking opioid prescriptions. Functional ability and status Nutritional status Physical activity Advanced directives List of other physicians Hospitalizations, surgeries, and ER visits in previous 12 months Vitals Screenings to include cognitive, depression, and falls Referrals and appointments  In addition, I have reviewed and discussed with patient certain preventive protocols, quality metrics, and best practice recommendations. A written personalized care plan for preventive services as well as general preventive health recommendations were provided to patient.     Roger Shelter, LPN   [id number]   Nurse Notes: pt is doing well and has no questions and/or concerns

## 2022-11-24 ENCOUNTER — Encounter: Payer: Self-pay | Admitting: Family Medicine

## 2023-05-16 DIAGNOSIS — M7742 Metatarsalgia, left foot: Secondary | ICD-10-CM | POA: Diagnosis not present

## 2023-06-27 DIAGNOSIS — M7742 Metatarsalgia, left foot: Secondary | ICD-10-CM | POA: Diagnosis not present

## 2023-08-03 DIAGNOSIS — Z872 Personal history of diseases of the skin and subcutaneous tissue: Secondary | ICD-10-CM | POA: Diagnosis not present

## 2023-08-03 DIAGNOSIS — L814 Other melanin hyperpigmentation: Secondary | ICD-10-CM | POA: Diagnosis not present

## 2023-08-03 DIAGNOSIS — D225 Melanocytic nevi of trunk: Secondary | ICD-10-CM | POA: Diagnosis not present

## 2023-08-03 DIAGNOSIS — L821 Other seborrheic keratosis: Secondary | ICD-10-CM | POA: Diagnosis not present

## 2023-08-03 DIAGNOSIS — D485 Neoplasm of uncertain behavior of skin: Secondary | ICD-10-CM | POA: Diagnosis not present

## 2023-08-03 DIAGNOSIS — Z09 Encounter for follow-up examination after completed treatment for conditions other than malignant neoplasm: Secondary | ICD-10-CM | POA: Diagnosis not present

## 2023-08-03 DIAGNOSIS — L538 Other specified erythematous conditions: Secondary | ICD-10-CM | POA: Diagnosis not present

## 2023-08-25 ENCOUNTER — Ambulatory Visit
Admission: EM | Admit: 2023-08-25 | Discharge: 2023-08-25 | Disposition: A | Discharge status: Home / Self Care | Payer: PPO

## 2023-08-25 DIAGNOSIS — J0100 Acute maxillary sinusitis, unspecified: Secondary | ICD-10-CM

## 2023-08-25 DIAGNOSIS — R030 Elevated blood-pressure reading, without diagnosis of hypertension: Secondary | ICD-10-CM | POA: Diagnosis not present

## 2023-08-25 MED ORDER — AMOXICILLIN-POT CLAVULANATE 875-125 MG PO TABS: 1.0000 | ORAL_TABLET | Freq: Two times a day (BID) | ORAL | 0 refills | Status: DC

## 2023-08-25 NOTE — ED Triage Notes (Signed)
Pt presents with nasal congestion, low grade temp (99) x 5 days. Tightness in face and neck. No home testing, no meds today but has been taking Robitussin DM and daytime/nighttime cold/flu, both help.    Wants an antibiotic.

## 2023-08-25 NOTE — Discharge Instructions (Addendum)
Take the Augmentin as directed.    Your blood pressure is elevated today at 160/83.  Please have this rechecked by your primary care provider in 1-2 weeks.

## 2023-08-25 NOTE — ED Provider Notes (Signed)
Shari Gonzalez    CSN: [id number] Arrival date & time: 08/25/23  0850      History   Chief Complaint Chief Complaint  Patient presents with   Nasal Congestion    HPI Shari Gonzalez is a 72 y.o. female.  Patient presents with 5-day history of congestion, sinus pressure, postnasal drip, cough.  Treatment attempted with OTC cold and cough medication.  No fever, shortness of breath, or other symptoms.  The history is provided by the patient and medical records.    Past Medical History:  Diagnosis Date   Allergy    seasonal   Breast cancer ([facility])    left    Cyst of left knee joint    Diarrhea    Heart murmur     Patient Active Problem List   Diagnosis Date Noted   Elevated TSH 11/09/2022   History of frequent URI 10/31/2022   Lipid screening 10/31/2022   Plantar fasciitis of left foot 05/20/2022   Left hip pain 04/22/2022   Medicare annual wellness visit, subsequent 10/13/2021   Colon cancer screening 10/13/2021   Medicare annual wellness visit, initial 08/13/2019   Welcome to Medicare preventive visit 08/08/2018   Routine general medical examination at a health care facility 07/09/2018   Allergic rhinitis 08/23/2016   Patellar tendinitis of left knee 03/08/2016   Perimeniscal cyst of left knee 11/10/2015   History of breast cancer 04/14/2014   Bloating 04/08/2013    Past Surgical History:  Procedure Laterality Date   ABDOMINAL HYSTERECTOMY     complete with BSO   AUGMENTATION MAMMAPLASTY Bilateral    BREAST RECONSTRUCTION WITH PLACEMENT OF TISSUE EXPANDER AND FLEX HD (ACELLULAR HYDRATED DERMIS)     COLONOSCOPY  2006   MASTECTOMY Left July 2012    mastectomy    OB History   No obstetric history on file.      Home Medications    Prior to Admission medications   Medication Sig Start Date End Date Taking? Authorizing Provider  amoxicillin-clavulanate (AUGMENTIN) 875-125 MG tablet Take 1 tablet by mouth every 12 (twelve) hours. 08/25/23  Yes  Mickie Bail, NP  cholecalciferol (VITAMIN D) 1000 units tablet Take 2,000 Units by mouth daily.   Yes [provider]  Estradiol 10 MCG TABS vaginal tablet Twice weekly 12/27/16  Yes [provider]  loratadine (CLARITIN) 10 MG tablet Take 10 mg by mouth as needed.    Yes [provider]  Magnesium Chloride (MAGNESIUM DR PO) Take 1 tablet by mouth daily.   Yes [provider]  Multiple Vitamins-Minerals (MULTIVITAMIN WITH MINERALS) tablet Take 1 tablet by mouth daily.     Yes [provider]  POTASSIUM CHLORIDE PO Take by mouth.   Yes [provider]  Probiotic Product (PROBIOTIC DAILY PO) Take by mouth.   Yes [provider]  Pyridoxine HCl (VITAMIN B6 PO) Take 1 capsule by mouth daily.   Yes [provider]  TURMERIC PO Take by mouth.   Yes [provider]    Family History Family History  Problem Relation Age of Onset   Alzheimer's disease Mother    Kidney cancer Father    Lung cancer Father    Gout Father    Diabetes Paternal Grandmother    Breast cancer Maternal Aunt    Liver cancer Paternal Grandfather    Colon cancer Neg Hx    Colon polyps Neg Hx    Esophageal cancer Neg Hx    Rectal cancer  Neg Hx    Stomach cancer Neg Hx     Social History Social History   Tobacco Use   Smoking status: Never   Smokeless tobacco: Never  Substance Use Topics   Alcohol use: Yes    Alcohol/week: 2.0 standard drinks of alcohol    Types: 2 Glasses of wine per week    Comment: Occasional   Drug use: No     Allergies   Ambrosia artemisiifolia (ragweed) skin test, Dilaudid [hydromorphone hcl], and Guaifenesin er   Review of Systems Review of Systems  Constitutional:  Negative for chills and fever.  HENT:  Positive for congestion, postnasal drip, rhinorrhea and sinus pressure. Negative for ear pain and sore throat.   Respiratory:  Positive for cough. Negative for shortness of breath.      Physical  Exam Triage Vital Signs ED Triage Vitals  Encounter Vitals Group     BP 08/25/23 0951 (!) 160/83     Systolic BP Percentile --      Diastolic BP Percentile --      Pulse Rate 08/25/23 0951 81     Resp 08/25/23 0951 18     Temp 08/25/23 0951 99.8 F (37.7 C)     Temp Source 08/25/23 0951 Oral     SpO2 08/25/23 0951 97 %     Weight --      Height --      Head Circumference --      Peak Flow --      Pain Score 08/25/23 0945 0     Pain Loc --      Pain Education --      Exclude from Growth Chart --    No data found.  Updated Vital Signs BP (!) 160/83 (BP Location: Right Arm)   Pulse 81   Temp 99.8 F (37.7 C) (Oral)   Resp 18   SpO2 97%   Visual Acuity Right Eye Distance:   Left Eye Distance:   Bilateral Distance:    Right Eye Near:   Left Eye Near:    Bilateral Near:     Physical Exam Constitutional:      General: She is not in acute distress. HENT:     Right Ear: Tympanic membrane normal.     Left Ear: Tympanic membrane normal.     Nose: Congestion and rhinorrhea present.     Mouth/Throat:     Mouth: Mucous membranes are moist.     Pharynx: Oropharynx is clear.  Cardiovascular:     Rate and Rhythm: Normal rate and regular rhythm.     Heart sounds: Normal heart sounds.  Pulmonary:     Effort: Pulmonary effort is normal. No respiratory distress.     Breath sounds: Normal breath sounds.  Skin:    General: Skin is warm and dry.  Neurological:     Mental Status: She is alert.      UC Treatments / Results  Labs (all labs ordered are listed, but only abnormal results are displayed) Labs Reviewed - No data to display  EKG   Radiology No results found.  Procedures Procedures (including critical care time)  Medications Ordered in UC Medications - No data to display  Initial Impression / Assessment and Plan / UC Course  I have reviewed the triage vital signs and the nursing notes.  Pertinent labs & imaging results that were available during my  care of the patient were reviewed by me and considered in my medical decision making (see  chart for details).    Acute sinusitis.  Elevated blood pressure reading.  Patient has been symptomatic for 5 days and is not improving with OTC treatment.  Treating today with Augmentin.  Cautioned patient to avoid OTC medications that can elevate her blood pressure as her blood pressure reading today is 160/83.  She reports no history of hypertension.  Instructed her to have this rechecked by her PCP.  Education provided on sinus infection and preventing hypertension.  She agrees to plan of care.  Final Clinical Impressions(s) / UC Diagnoses   Final diagnoses:  Acute non-recurrent maxillary sinusitis  Elevated blood pressure reading     Discharge Instructions      Take the Augmentin as directed.    Your blood pressure is elevated today at 160/83.  Please have this rechecked by your primary care provider in 1-2 weeks.          ED Prescriptions     Medication Sig Dispense Auth. Provider   amoxicillin-clavulanate (AUGMENTIN) 875-125 MG tablet Take 1 tablet by mouth every 12 (twelve) hours. 14 tablet Mickie Bail, NP      PDMP not reviewed this encounter.   Mickie Bail, NP 08/25/23 1014

## 2023-08-28 ENCOUNTER — Telehealth: Payer: Self-pay

## 2023-08-28 NOTE — Telephone Encounter (Signed)
Unable to reach pt and left v/m  per DPR to call [facility] [phone] for update on condition and to schedule appt. Sending note to Dr Milinda Antis and [employer].

## 2023-11-01 DIAGNOSIS — M3501 Sicca syndrome with keratoconjunctivitis: Secondary | ICD-10-CM | POA: Diagnosis not present

## 2023-11-01 DIAGNOSIS — H2511 Age-related nuclear cataract, right eye: Secondary | ICD-10-CM | POA: Diagnosis not present

## 2023-11-01 DIAGNOSIS — H43813 Vitreous degeneration, bilateral: Secondary | ICD-10-CM | POA: Diagnosis not present

## 2023-11-01 DIAGNOSIS — H35371 Puckering of macula, right eye: Secondary | ICD-10-CM | POA: Diagnosis not present

## 2023-11-14 ENCOUNTER — Encounter: Payer: Self-pay | Admitting: Family Medicine

## 2023-11-14 ENCOUNTER — Ambulatory Visit (INDEPENDENT_AMBULATORY_CARE_PROVIDER_SITE_OTHER): Payer: PPO | Admitting: Family Medicine

## 2023-11-14 VITALS — BP 132/72 | HR 65 | Temp 98.2°F | Ht 62.75 in | Wt 125.2 lb

## 2023-11-14 DIAGNOSIS — R7989 Other specified abnormal findings of blood chemistry: Secondary | ICD-10-CM

## 2023-11-14 DIAGNOSIS — J309 Allergic rhinitis, unspecified: Secondary | ICD-10-CM | POA: Diagnosis not present

## 2023-11-14 DIAGNOSIS — Z13220 Encounter for screening for lipoid disorders: Secondary | ICD-10-CM | POA: Diagnosis not present

## 2023-11-14 DIAGNOSIS — Z1211 Encounter for screening for malignant neoplasm of colon: Secondary | ICD-10-CM

## 2023-11-14 DIAGNOSIS — Z0000 Encounter for general adult medical examination without abnormal findings: Secondary | ICD-10-CM | POA: Diagnosis not present

## 2023-11-14 DIAGNOSIS — Z853 Personal history of malignant neoplasm of breast: Secondary | ICD-10-CM

## 2023-11-14 DIAGNOSIS — Z23 Encounter for immunization: Secondary | ICD-10-CM

## 2023-11-14 NOTE — Assessment & Plan Note (Signed)
 Claritin remains on med list as needed

## 2023-11-14 NOTE — Assessment & Plan Note (Signed)
 With left mastectomy  Has mammogram planned next wk at gyn  Continues gyn exams yearly

## 2023-11-14 NOTE — Assessment & Plan Note (Signed)
 TSH and Ft4 today   No clinical changes

## 2023-11-14 NOTE — Progress Notes (Signed)
 Subjective:    Patient ID: Shari Gonzalez, female    DOB: January 21, 1951, 73 y.o.   MRN: [id number]  HPI  Here for health maintenance exam and to review chronic medical problems   Wt Readings from Last 3 Encounters:  11/14/23 125 lb 4 oz (56.8 kg)  11/16/22 124 lb (56.2 kg)  11/09/22 124 lb (56.2 kg)   22.36 kg/m  Vitals:   11/14/23 1522  BP: 132/72  Pulse: 65  Temp: 98.2 F (36.8 C)  SpO2: 100%    Immunization History  Administered Date(s) Administered   Fluad Quad(high Dose 65+) 08/13/2019, 08/19/2020, 10/13/2021   Fluad Trivalent(High Dose 65+) 11/14/2023   Influenza,inj,Quad PF,6+ Mos 08/08/2018   PFIZER(Purple Top)SARS-COV-2 Vaccination 10/18/2019, 11/08/2019, 10/01/2020   Pneumococcal Conjugate-13 08/08/2018   Pneumococcal Polysaccharide-23 08/13/2019    Health Maintenance Due  Topic Date Due   MAMMOGRAM  10/06/2023   Feeling better now  Had a lot of uri s -finally better    Mammogram 09/2022 - has one planned next Thursday at gyn  Personal history of breast cancer with left mastectomy  Self breast exam-no changes   Gyn health [facility] -next [facility]   Colon cancer screening  Colonoscopy 10/2015  Cologuard neg 10/2021  No bowel change   Bone health  Dexa  06/2019 -[facility] -per pt normal  Falls- none  Fractures-none  Supplements : vit D , also calcium    Exercise :  Goes to they gym [employer] and spinning  - really likes it   Has derm appointment next mo   Has cataract surgery coming up    Mood    11/14/2023    4:04 PM 11/09/2022    2:59 PM 10/13/2021   11:02 AM 08/19/2020    3:22 PM 08/13/2019    2:36 PM  Depression screen PHQ 2/9  Decreased Interest 0 0 0 0 0  Down, Depressed, Hopeless 0 0 0 0 0  PHQ - 2 Score 0 0 0 0 0  Altered sleeping 0 0     Tired, decreased energy 0 0     Change in appetite 0 0     Feeling bad or failure about yourself  0 0     Trouble concentrating 0 0      Moving slowly or fidgety/restless 0 0     Suicidal thoughts 0 0     PHQ-9 Score 0 0     Difficult doing work/chores Not difficult at all Not difficult at all       Lab Results  Component Value Date   TSH 6.25 (H) 11/02/2022  This was elevated last time  FT4 was normal at 0.9   Lipid screen due  Lab Results  Component Value Date   CHOL 134 11/02/2022   HDL 44.50 11/02/2022   LDLCALC 75 11/02/2022   TRIG 71.0 11/02/2022   CHOLHDL 3 11/02/2022   Is a healthy eater in general Good exercise     Patient Active Problem List   Diagnosis Date Noted   Elevated TSH 11/09/2022   History of frequent URI 10/31/2022   Lipid screening 10/31/2022   Plantar fasciitis of left foot 05/20/2022   Left hip pain 04/22/2022   Colon cancer screening 10/13/2021   Routine general medical examination at a health care facility 07/09/2018   Allergic rhinitis 08/23/2016   History of breast cancer 04/14/2014   Past Medical History:  Diagnosis Date  Allergy    seasonal   Breast cancer (HCC)    left    Cyst of left knee joint    Diarrhea    Heart murmur    Past Surgical History:  Procedure Laterality Date   ABDOMINAL HYSTERECTOMY     complete with BSO   AUGMENTATION MAMMAPLASTY Bilateral    BREAST RECONSTRUCTION WITH PLACEMENT OF TISSUE EXPANDER AND FLEX HD (ACELLULAR HYDRATED DERMIS)     COLONOSCOPY  2006   MASTECTOMY Left July 2012    mastectomy   Social History   Tobacco Use   Smoking status: Never   Smokeless tobacco: Never  Substance Use Topics   Alcohol use: Yes    Alcohol/week: 2.0 standard drinks of alcohol    Types: 2 Glasses of wine per week    Comment: Occasional   Drug use: No   Family History  Problem Relation Age of Onset   Alzheimer's disease Mother    Kidney cancer Father    Lung cancer Father    Gout Father    Diabetes Paternal Grandmother    Breast cancer Maternal Aunt    Liver cancer Paternal Grandfather    Colon cancer Neg Hx    Colon polyps Neg  Hx    Esophageal cancer Neg Hx    Rectal cancer Neg Hx    Stomach cancer Neg Hx    Allergies  Allergen Reactions   Ambrosia Artemisiifolia (Ragweed) Skin Test Other (See Comments)   Dilaudid [Hydromorphone Hcl] Rash   Guaifenesin Er Rash   Current Outpatient Medications on File Prior to Visit  Medication Sig Dispense Refill   cholecalciferol (VITAMIN D) 1000 units tablet Take 2,000 Units by mouth daily.     Estradiol 10 MCG TABS vaginal tablet Twice weekly     loratadine (CLARITIN) 10 MG tablet Take 10 mg by mouth as needed.      Magnesium Chloride (MAGNESIUM DR PO) Take 1 tablet by mouth daily.     Multiple Vitamins-Minerals (MULTIVITAMIN WITH MINERALS) tablet Take 1 tablet by mouth daily.       POTASSIUM CHLORIDE PO Take by mouth.     Probiotic Product (PROBIOTIC DAILY PO) Take by mouth.     Pyridoxine HCl (VITAMIN B6 PO) Take 1 capsule by mouth daily.     TURMERIC PO Take by mouth.     No current facility-administered medications on file prior to visit.    Review of Systems  Constitutional:  Negative for activity change, appetite change, fatigue, fever and unexpected weight change.  HENT:  Negative for congestion, ear pain, rhinorrhea, sinus pressure and sore throat.   Eyes:  Negative for pain, redness and visual disturbance.  Respiratory:  Negative for cough, shortness of breath and wheezing.   Cardiovascular:  Negative for chest pain and palpitations.  Gastrointestinal:  Negative for abdominal pain, blood in stool, constipation and diarrhea.  Endocrine: Negative for polydipsia and polyuria.  Genitourinary:  Negative for dysuria, frequency and urgency.  Musculoskeletal:  Negative for arthralgias, back pain and myalgias.  Skin:  Negative for pallor and rash.  Allergic/Immunologic: Negative for environmental allergies.  Neurological:  Negative for dizziness, syncope and headaches.  Hematological:  Negative for adenopathy. Does not bruise/bleed easily.   Psychiatric/Behavioral:  Negative for decreased concentration and dysphoric mood. The patient is not nervous/anxious.        Objective:   Physical Exam Constitutional:      General: She is not in acute distress.    Appearance: Normal appearance. She is  well-developed and normal weight. She is not ill-appearing or diaphoretic.  HENT:     Head: Normocephalic and atraumatic.     Right Ear: Tympanic membrane, ear canal and external ear normal.     Left Ear: Tympanic membrane, ear canal and external ear normal.     Nose: Nose normal. No congestion.     Mouth/Throat:     Mouth: Mucous membranes are moist.     Pharynx: Oropharynx is clear. No posterior oropharyngeal erythema.  Eyes:     General: No scleral icterus.    Extraocular Movements: Extraocular movements intact.     Conjunctiva/sclera: Conjunctivae normal.     Pupils: Pupils are equal, round, and reactive to light.  Neck:     Thyroid: No thyromegaly.     Vascular: No carotid bruit or JVD.  Cardiovascular:     Rate and Rhythm: Normal rate and regular rhythm.     Pulses: Normal pulses.     Heart sounds: Normal heart sounds.     No gallop.  Pulmonary:     Effort: Pulmonary effort is normal. No respiratory distress.     Breath sounds: Normal breath sounds. No wheezing.     Comments: Good air exch Chest:     Chest wall: No tenderness.  Abdominal:     General: Bowel sounds are normal. There is no distension or abdominal bruit.     Palpations: Abdomen is soft. There is no mass.     Tenderness: There is no abdominal tenderness.     Hernia: No hernia is present.  Genitourinary:    Comments: Breast exam: No mass, nodules, thickening, tenderness, bulging, retraction, inflamation, nipple discharge or skin changes noted.  No axillary or clavicular LA.     Musculoskeletal:        General: No tenderness. Normal range of motion.     Cervical back: Normal range of motion and neck supple. No rigidity. No muscular tenderness.     Right  lower leg: No edema.     Left lower leg: No edema.     Comments: No kyphosis   Lymphadenopathy:     Cervical: No cervical adenopathy.  Skin:    General: Skin is warm and dry.     Coloration: Skin is not pale.     Findings: No erythema or rash.     Comments: Solar lentigines diffusely   Neurological:     Mental Status: She is alert. Mental status is at baseline.     Cranial Nerves: No cranial nerve deficit.     Motor: No abnormal muscle tone.     Coordination: Coordination normal.     Gait: Gait normal.     Deep Tendon Reflexes: Reflexes are normal and symmetric. Reflexes normal.  Psychiatric:        Mood and Affect: Mood normal.        Cognition and Memory: Cognition and memory normal.           Assessment & Plan:   Problem List Items Addressed This Visit       Respiratory   Allergic rhinitis   Claritin remains on med list as needed         Other   Routine general medical examination at a health care facility - Primary   Reviewed health habits including diet and exercise and skin cancer prevention Reviewed appropriate screening tests for age  Also reviewed health mt list, fam hx and immunization status , as well as social and family history  See HPI Labs reviewed and ordered Health Maintenance  Topic Date Due   Mammogram  10/06/2023   Medicare Annual Wellness Visit  11/17/2023   Zoster (Shingles) Vaccine (1 of 2) 02/11/2024*   DTaP/Tdap/Td vaccine (1 - Tdap) 11/13/2024*   COVID-19 Vaccine (4 - 2024-25 season) 11/29/2024*   Colon Cancer Screening  11/12/2025   Pneumonia Vaccine  Completed   Flu Shot  Completed   DEXA scan (bone density measurement)  Completed   Hepatitis C Screening  Completed   HPV Vaccine  Aged Out  *Topic was postponed. The date shown is not the original due date.   Mammogram planned next wk at gyn along with exam  Cologuard neg 2023  Discussed fall prevention, supplements and exercise for bone density   Sent for last dexa report  from gyn office Encouraged follow up with dermatology for skin screening  Encouraged follow up with oph for cataract surgery  PHQ 0 Labs today Flu vaccine today        Lipid screening   Lipid labs today  Disc goals for lipids and reasons to control them Rev last labs with pt Rev low sat fat diet in detail  Overall diet is good       Relevant Orders   Lipid Panel   History of breast cancer   With left mastectomy  Has mammogram planned next wk at gyn  Continues gyn exams yearly       Relevant Orders   Comprehensive metabolic panel   CBC with Differential/Platelet   Elevated TSH   TSH and Ft4 today   No clinical changes      Relevant Orders   TSH   T4, free   Colon cancer screening   Colonoscopy 10/1015 Cologuard negative 10/2021 -good for 3 years       Other Visit Diagnoses       Need for influenza vaccination       Relevant Orders   Flu Vaccine Trivalent High Dose (Fluad) (Completed)

## 2023-11-14 NOTE — Assessment & Plan Note (Signed)
 Lipid labs today  Disc goals for lipids and reasons to control them Rev last labs with pt Rev low sat fat diet in detail  Overall diet is good

## 2023-11-14 NOTE — Assessment & Plan Note (Addendum)
 Reviewed health habits including diet and exercise and skin cancer prevention Reviewed appropriate screening tests for age  Also reviewed health mt list, fam hx and immunization status , as well as social and family history   See HPI Labs reviewed and ordered Health Maintenance  Topic Date Due   Mammogram  10/06/2023   Medicare Annual Wellness Visit  11/17/2023   Zoster (Shingles) Vaccine (1 of 2) 02/11/2024*   DTaP/Tdap/Td vaccine (1 - Tdap) 11/13/2024*   COVID-19 Vaccine (4 - 2024-25 season) 11/29/2024*   Colon Cancer Screening  11/12/2025   Pneumonia Vaccine  Completed   Flu Shot  Completed   DEXA scan (bone density measurement)  Completed   Hepatitis C Screening  Completed   HPV Vaccine  Aged Out  *Topic was postponed. The date shown is not the original due date.   Mammogram planned next wk at gyn along with exam  Cologuard neg 2023  Discussed fall prevention, supplements and exercise for bone density   Sent for last dexa report from gyn office Encouraged follow up with dermatology for skin screening  Encouraged follow up with oph for cataract surgery  PHQ 0 Labs today Flu vaccine today

## 2023-11-14 NOTE — Assessment & Plan Note (Signed)
 Colonoscopy 10/1015 Cologuard negative 10/2021 -good for 3 years

## 2023-11-14 NOTE — Patient Instructions (Addendum)
 Flu shot today  Labs today   Take care of yourself  Stay active   Follow up with dermatology and gyn as planned

## 2023-11-15 ENCOUNTER — Encounter: Payer: Self-pay | Admitting: Family Medicine

## 2023-11-15 LAB — CBC WITH DIFFERENTIAL/PLATELET
Absolute Lymphocytes: 1611 {cells}/uL (ref 850–3900)
Absolute Monocytes: 392 {cells}/uL (ref 200–950)
Basophils Absolute: 58 {cells}/uL (ref 0–200)
Basophils Relative: 1.1 %
Eosinophils Absolute: 101 {cells}/uL (ref 15–500)
Eosinophils Relative: 1.9 %
HCT: 41.8 % (ref 35.0–45.0)
Hemoglobin: 14 g/dL (ref 11.7–15.5)
MCH: 30.8 pg (ref 27.0–33.0)
MCHC: 33.5 g/dL (ref 32.0–36.0)
MCV: 91.9 fL (ref 80.0–100.0)
MPV: 11.9 fL (ref 7.5–12.5)
Monocytes Relative: 7.4 %
Neutro Abs: 3138 {cells}/uL (ref 1500–7800)
Neutrophils Relative %: 59.2 %
Platelets: 227 10*3/uL (ref 140–400)
RBC: 4.55 10*6/uL (ref 3.80–5.10)
RDW: 12.7 % (ref 11.0–15.0)
Total Lymphocyte: 30.4 %
WBC: 5.3 10*3/uL (ref 3.8–10.8)

## 2023-11-15 LAB — COMPREHENSIVE METABOLIC PANEL
AG Ratio: 2.4 (calc) (ref 1.0–2.5)
ALT: 19 U/L (ref 6–29)
AST: 23 U/L (ref 10–35)
Albumin: 4.6 g/dL (ref 3.6–5.1)
Alkaline phosphatase (APISO): 87 U/L (ref 37–153)
BUN: 17 mg/dL (ref 7–25)
CO2: 26 mmol/L (ref 20–32)
Calcium: 10.1 mg/dL (ref 8.6–10.4)
Chloride: 104 mmol/L (ref 98–110)
Creat: 0.84 mg/dL (ref 0.60–1.00)
Globulin: 1.9 g/dL (ref 1.9–3.7)
Glucose, Bld: 77 mg/dL (ref 65–99)
Potassium: 4.5 mmol/L (ref 3.5–5.3)
Sodium: 142 mmol/L (ref 135–146)
Total Bilirubin: 0.7 mg/dL (ref 0.2–1.2)
Total Protein: 6.5 g/dL (ref 6.1–8.1)

## 2023-11-15 LAB — LIPID PANEL
Cholesterol: 190 mg/dL (ref <200)
HDL: 73 mg/dL (ref >=50)
LDL Cholesterol (Calc): 103 mg/dL — ABNORMAL HIGH
Non-HDL Cholesterol (Calc): 117 mg/dL (ref <130)
Total CHOL/HDL Ratio: 2.6 (calc) (ref <5.0)
Triglycerides: 59 mg/dL (ref <150)

## 2023-11-15 LAB — T4, FREE: Free T4: 1.3 ng/dL (ref 0.8–1.8)

## 2023-11-15 LAB — TSH: TSH: 3.54 m[IU]/L (ref 0.40–4.50)

## 2023-11-20 DIAGNOSIS — H2511 Age-related nuclear cataract, right eye: Secondary | ICD-10-CM | POA: Diagnosis not present

## 2023-11-21 ENCOUNTER — Encounter: Payer: Self-pay | Admitting: Ophthalmology

## 2023-11-21 NOTE — Anesthesia Preprocedure Evaluation (Addendum)
 Anesthesia Evaluation  Patient identified by MRN, date of birth, ID band Patient awake    Reviewed: Allergy & Precautions, H&P , NPO status , Patient's Chart, lab work & pertinent test results  History of Anesthesia Complications (+) PONV and history of anesthetic complications  Airway Mallampati: III  TM Distance: >3 FB Neck ROM: Full    Dental no notable dental hx.    Pulmonary neg pulmonary ROS   Pulmonary exam normal breath sounds clear to auscultation       Cardiovascular negative cardio ROS Normal cardiovascular exam+ Valvular Problems/Murmurs  Rhythm:Regular Rate:Normal     Neuro/Psych negative neurological ROS  negative psych ROS   GI/Hepatic negative GI ROS, Neg liver ROS,,,  Endo/Other  negative endocrine ROS    Renal/GU negative Renal ROS  negative genitourinary   Musculoskeletal negative musculoskeletal ROS (+)    Abdominal   Peds negative pediatric ROS (+)  Hematology negative hematology ROS (+)   Anesthesia Other Findings Allergy  Breast cancer (HCC) Heart murmur  Diarrhea Cyst of left knee joint  PONV (postoperative nausea and vomiting)    Reproductive/Obstetrics negative OB ROS                              Anesthesia Physical Anesthesia Plan  ASA: 2  Anesthesia Plan: General   Post-op Pain Management:    Induction: Intravenous  PONV Risk Score and Plan:   Airway Management Planned: Natural Airway and Nasal Cannula  Additional Equipment:   Intra-op Plan:   Post-operative Plan:   Informed Consent: I have reviewed the patients History and Physical, chart, labs and discussed the procedure including the risks, benefits and alternatives for the proposed anesthesia with the patient or authorized representative who has indicated his/her understanding and acceptance.     Dental Advisory Given  Plan Discussed with: Anesthesiologist, CRNA and  Surgeon  Anesthesia Plan Comments: (Patient consented for risks of anesthesia including but not limited to:  - adverse reactions to medications - risk of airway placement if required - damage to eyes, teeth, lips or other oral mucosa - nerve damage due to positioning  - sore throat or hoarseness - Damage to heart, brain, nerves, lungs, other parts of body or loss of life  Patient voiced understanding and assent.)         Anesthesia Quick Evaluation

## 2023-11-22 ENCOUNTER — Ambulatory Visit (INDEPENDENT_AMBULATORY_CARE_PROVIDER_SITE_OTHER): Payer: PPO

## 2023-11-22 VITALS — Ht 64.0 in | Wt 123.0 lb

## 2023-11-22 DIAGNOSIS — Z0000 Encounter for general adult medical examination without abnormal findings: Secondary | ICD-10-CM | POA: Diagnosis not present

## 2023-11-22 NOTE — Progress Notes (Signed)
 Subjective:   Shari Gonzalez is a 73 y.o. who presents for a Medicare Wellness preventive visit.  Visit Complete: Virtual I connected with  [name] on 11/22/23 by a audio enabled telemedicine application and verified that I am speaking with the correct person using two identifiers.  Patient Location: Home  Provider Location: Office/Clinic  I discussed the limitations of evaluation and management by telemedicine. The patient expressed understanding and agreed to proceed.  Vital Signs: Because this visit was a virtual/telehealth visit, some criteria may be missing or patient reported. Any vitals not documented were not able to be obtained and vitals that have been documented are patient reported.  VideoDeclined- This patient declined Librarian, academic. Therefore the visit was completed with audio only.  AWV Questionnaire: No: Patient Medicare AWV questionnaire was not completed prior to this visit.  Cardiac Risk Factors include: advanced age (>94men, >58 women)     Objective:    Today's Vitals   11/22/23 1428  Weight: 123 lb (55.8 kg)  Height: 5\' 4"  (1.626 m)   Body mass index is 21.11 kg/m.     11/22/2023    2:27 PM 09/09/2022    4:08 PM 09/09/2022    4:03 PM 11/13/2015    2:20 PM 09/29/2015   11:05 AM 04/06/2015    2:45 PM  Advanced Directives  Does Patient Have a Medical Advance Directive? Yes Yes No No No No  Type of Estate agent of [place];Living will Healthcare Power of [place];Living will      Copy of Healthcare Power of Attorney in Chart? No - copy requested         Current Medications (verified) Outpatient Encounter Medications as of 11/22/2023  Medication Sig   cholecalciferol (VITAMIN D) 1000 units tablet Take 2,000 Units by mouth daily.   Estradiol 10 MCG TABS vaginal tablet Twice weekly   loratadine (CLARITIN) 10 MG tablet Take 10 mg by mouth as needed.    Magnesium Chloride (MAGNESIUM DR  PO) Take 1 tablet by mouth daily.   Multiple Vitamins-Minerals (MULTIVITAMIN WITH MINERALS) tablet Take 1 tablet by mouth daily.     POTASSIUM CHLORIDE PO Take by mouth.   Probiotic Product (PROBIOTIC DAILY PO) Take by mouth.   Pyridoxine HCl (VITAMIN B6 PO) Take 1 capsule by mouth daily.   TURMERIC PO Take by mouth.   No facility-administered encounter medications on file as of 11/22/2023.    Allergies (verified) Ambrosia artemisiifolia (ragweed) skin test, Dilaudid [hydromorphone hcl], and Guaifenesin er   History: Past Medical History:  Diagnosis Date   Allergy    seasonal   Breast cancer (HCC)    left    Cyst of left knee joint    Diarrhea    Heart murmur    PONV (postoperative nausea and vomiting)    Past Surgical History:  Procedure Laterality Date   ABDOMINAL HYSTERECTOMY     complete with BSO   AUGMENTATION MAMMAPLASTY Bilateral    BREAST RECONSTRUCTION WITH PLACEMENT OF TISSUE EXPANDER AND FLEX HD (ACELLULAR HYDRATED DERMIS)     COLONOSCOPY  2006   MASTECTOMY Left July 2012    mastectomy   Family History  Problem Relation Age of Onset   Alzheimer's disease Mother    Kidney cancer Father    Lung cancer Father    Gout Father    Diabetes Paternal Grandmother    Breast cancer Maternal Aunt    Liver cancer Paternal Grandfather    Colon cancer  Neg Hx    Colon polyps Neg Hx    Esophageal cancer Neg Hx    Rectal cancer Neg Hx    Stomach cancer Neg Hx    Social History   Socioeconomic History   Marital status: Married    Spouse name: Not on file   Number of children: 2   Years of education: Not on file   Highest education level: Not on file  Occupational History    Employer: [employer]  Tobacco Use   Smoking status: Never    Passive exposure: Never   Smokeless tobacco: Never  Vaping Use   Vaping status: Never Used  Substance and Sexual Activity   Alcohol use: Yes    Alcohol/week: 2.0 standard drinks of alcohol    Types: 1 Glasses of wine, 1  Cans of beer per week    Comment: Occasional   Drug use: No   Sexual activity: Yes    Birth control/protection: None  Other Topics Concern   Not on file  Social History Narrative   Not on file   Social Drivers of Health   Financial Resource Strain: Low Risk  (11/22/2023)   Overall Financial Resource Strain (CARDIA)    Difficulty of Paying Living Expenses: Not hard at all  Food Insecurity: No Food Insecurity (11/22/2023)   Hunger Vital Sign    Worried About Running Out of Food in the Last Year: Never true    Ran Out of Food in the Last Year: Never true  Transportation Needs: No Transportation Needs (11/22/2023)   PRAPARE - Administrator, Civil Service (Medical): No    Lack of Transportation (Non-Medical): No  Physical Activity: Sufficiently Active (11/22/2023)   Exercise Vital Sign    Days of Exercise per Week: 4 days    Minutes of Exercise per Session: 130 min  Stress: No Stress Concern Present (11/22/2023)   [employer] of Occupational Health - Occupational Stress Questionnaire    Feeling of Stress : Not at all  Social Connections: Socially Integrated (11/22/2023)   Social Connection and Isolation Panel [NHANES]    Frequency of Communication with Friends and Family: More than three times a week    Frequency of Social Gatherings with Friends and Family: More than three times a week    Attends Religious Services: More than 4 times per year    Active Member of [employer] or Organizations: Yes    Attends Engineer, structural: More than 4 times per year    Marital Status: Married    Tobacco Counseling - Non Smoker Counseling given - N/A  Clinical Intake: Completed 11/22/2023  Pre-visit preparation completed: Yes  Pain : No/denies pain     BMI - recorded: 21.11 Nutritional Risks: None Diabetes: No  How often do you need to have someone help you when you read instructions, pamphlets, or other written materials from your doctor or pharmacy?: 1 -  Never  Interpreter Needed?: No  Information entered by :: Hassell Halim, CMA   Activities of Daily Living: Completed 11/22/2023    11/22/2023    2:31 PM  In your present state of health, do you have any difficulty performing the following activities:  Hearing? 0  Vision? 0  Difficulty concentrating or making decisions? 0  Walking or climbing stairs? 0  Dressing or bathing? 0  Doing errands, shopping? 0  Preparing Food and eating ? N  Using the Toilet? N  In the past six months, have you accidently leaked  urine? N  Do you have problems with loss of bowel control? N  Managing your Medications? N  Managing your Finances? N  Housekeeping or managing your Housekeeping? N    Patient Care Team: Tower, Audrie Gallus, MD as PCP - General Pierce Crane, MD (Inactive) as Consulting Physician (Hematology and Oncology) Etter Sjogren, MD as Consulting Physician (Plastic Surgery)  Indicate any recent Medical Services you may have received from other than [facility] providers in the past year (date may be approximate).     Assessment:   This is a routine wellness examination for Ko[place].  Hearing/Vision screen Hearing Screening - Comments:: Denies hearing difficulties   Vision Screening - Comments:: Wears rx glasses - up to date with routine eye exams with [facility]   Goals Addressed               This Visit's Progress     Patient Stated (pt-stated)        Patient stated she plans to stay active and exercise.       Depression Screen Completed 11/22/2023    11/22/2023    2:36 PM 11/14/2023    4:04 PM 11/09/2022    2:59 PM 10/13/2021   11:02 AM 08/19/2020    3:22 PM 08/13/2019    2:36 PM 08/08/2018    2:35 PM  PHQ 2/9 Scores  PHQ - 2 Score 0 0 0 0 0 0 0  PHQ- 9 Score 0 0 0        Fall Risk Completed 11/22/2023    11/22/2023    2:32 PM 11/14/2023    4:04 PM 11/16/2022    2:00 PM 11/09/2022    2:59 PM 10/13/2021   11:02 AM  Fall Risk   Falls in the past year? 0 0 0 0 1   Number falls in past yr: 0 0 0 0 0  Injury with Fall? 0 0 0 0 0  Risk for fall due to : No Fall Risks No Fall Risks No Fall Risks No Fall Risks   Follow up Falls evaluation completed;Falls prevention discussed Falls evaluation completed Education provided;Falls prevention discussed Falls evaluation completed Falls evaluation completed   MEDICARE RISK AT HOME: Completed 11/22/2023 Medicare Risk at Home Any stairs in or around the home?: Yes If so, are there any without handrails?: No Home free of loose throw rugs in walkways, pet beds, electrical cords, etc?: Yes Adequate lighting in your home to reduce risk of falls?: Yes Life alert?: No Use of a cane, walker or w/c?: No Grab bars in the bathroom?: No Shower chair or bench in shower?: No Elevated toilet seat or a handicapped toilet?: No  TIMED UP AND GO:  Was the test performed?  No  Cognitive Function: 6CIT completed        11/22/2023    2:32 PM 11/16/2022    2:06 PM  6CIT Screen  What Year? 0 points 0 points  What month? 0 points 0 points  What time? 0 points 0 points  Count back from 20 0 points 0 points  Months in reverse 0 points 0 points  Repeat phrase 0 points 0 points  Total Score 0 points 0 points    Immunizations Immunization History  Administered Date(s) Administered   Fluad Quad(high Dose 65+) 08/13/2019, 08/19/2020, 10/13/2021   Fluad Trivalent(High Dose 65+) 11/14/2023   Influenza,inj,Quad PF,6+ Mos 08/08/2018   PFIZER(Purple Top)SARS-COV-2 Vaccination 10/18/2019, 11/08/2019, 10/01/2020   Pneumococcal Conjugate-13 08/08/2018   Pneumococcal Polysaccharide-23 08/13/2019  Screening Tests Health Maintenance  Topic Date Due   MAMMOGRAM  10/06/2023   Zoster Vaccines- Shingrix (1 of 2) 02/11/2024 (Originally 09/21/2002)   DTaP/Tdap/Td (1 - Tdap) 11/13/2024 (Originally [birth year]/1971)   COVID-19 Vaccine (4 - 2024-25 season) 11/29/2024 (Originally 05/28/2023)   Medicare Annual Wellness (AWV)  11/21/2024    Colonoscopy  11/12/2025   Pneumonia Vaccine 62+ Years old  Completed   INFLUENZA VACCINE  Completed   DEXA SCAN  Completed   Hepatitis C Screening  Completed   HPV VACCINES  Aged Out    Health Maintenance  Health Maintenance Due  Topic Date Due   MAMMOGRAM  10/06/2023   Health Maintenance Items Addressed: 11/22/2023. Pt declines the Shingles and COVID vaccines.    Influenza vaccine status: Completed 11/14/2023  Mammogram status: Completed 10/05/2022.  Due in 1 year.  Appt scheduled for 11/23/2023 patient stated.  DEXA scan status: Complete 07/02/2019 w/[facility]. Due in 5 years (06/2024).    Colonoscopy status: Completed 11/13/2015. Due in 10 years.  Additional Screening:  Vision Screening: Recommended annual ophthalmology exams for early detection of glaucoma and other disorders of the eye. Patient stated has annual eye exam w/[facility].  Due in 2025.  Dental Screening: Recommended annual dental exams for proper oral hygiene.  Community Resource Referral / Chronic Care Management: CRR required this visit?  No   CCM required this visit?  No     Plan:     I have personally reviewed and noted the following in the patient's chart:   Medical and social history Use of alcohol, tobacco or illicit drugs  Current medications and supplements including opioid prescriptions. Patient is not currently taking opioid prescriptions. Functional ability and status Nutritional status Physical activity Advanced directives List of other physicians Hospitalizations, surgeries, and ER visits in previous 12 months Vitals Screenings to include cognitive, depression, and falls Referrals and appointments  In addition, I have reviewed and discussed with patient certain preventive protocols, quality metrics, and best practice recommendations. A written personalized care plan for preventive services as well as general preventive health recommendations were provided  to patient.     Darreld Mclean, CMA   11/22/2023   After Visit Summary: (MyChart) Due to this being a telephonic visit, the after visit summary with patients personalized plan was offered to patient via MyChart   Notes: Nothing significant to report at this time.

## 2023-11-22 NOTE — Patient Instructions (Addendum)
 Shari Gonzalez , Thank you for taking time to come for your Medicare Wellness Visit. I appreciate your ongoing commitment to your health goals. Please review the following plan we discussed and let me know if I can assist you in the future.   Referrals/Orders/Follow-Ups/Clinician Recommendations: Aim for 30 minutes of exercise or brisk walking, 6-8 glasses of water, and 5 servings of fruits and vegetables each day. DEXA Scan due in 06/2024. Mammogram due in 2025 - patient stated scheduled for 11/23/2023.  Educated and advised on getting the Shingles and COVID vaccines at local pharmacy.    This is a list of the screening recommended for you and due dates:  Health Maintenance  Topic Date Due   Mammogram  10/06/2023   Zoster (Shingles) Vaccine (1 of 2) 02/11/2024*   DTaP/Tdap/Td vaccine (1 - Tdap) 11/13/2024*   COVID-19 Vaccine (4 - 2024-25 season) 11/29/2024*   Medicare Annual Wellness Visit  11/21/2024   Colon Cancer Screening  11/12/2025   Pneumonia Vaccine  Completed   Flu Shot  Completed   DEXA scan (bone density measurement)  Completed   Hepatitis C Screening  Completed   HPV Vaccine  Aged Out  *Topic was postponed. The date shown is not the original due date.    Advanced directives: (Copy Requested) Please bring a copy of your health care power of attorney and living will to the office to be added to your chart at your convenience.  Next Medicare Annual Wellness Visit scheduled for next year: Yes - 2026

## 2023-11-23 DIAGNOSIS — Z1231 Encounter for screening mammogram for malignant neoplasm of breast: Secondary | ICD-10-CM | POA: Diagnosis not present

## 2023-11-23 DIAGNOSIS — Z6821 Body mass index (BMI) 21.0-21.9, adult: Secondary | ICD-10-CM | POA: Diagnosis not present

## 2023-11-23 DIAGNOSIS — Z124 Encounter for screening for malignant neoplasm of cervix: Secondary | ICD-10-CM | POA: Diagnosis not present

## 2023-11-23 DIAGNOSIS — Z1272 Encounter for screening for malignant neoplasm of vagina: Secondary | ICD-10-CM | POA: Diagnosis not present

## 2023-11-27 NOTE — Discharge Instructions (Signed)

## 2023-11-28 ENCOUNTER — Encounter: Payer: Self-pay | Admitting: Ophthalmology

## 2023-11-28 ENCOUNTER — Encounter
Admission: RE | Disposition: A | Discharge status: Home / Self Care | Payer: Self-pay | Source: Home / Self Care | Attending: Ophthalmology

## 2023-11-28 ENCOUNTER — Other Ambulatory Visit: Payer: Self-pay

## 2023-11-28 ENCOUNTER — Ambulatory Visit
Admission: RE | Admit: 2023-11-28 | Discharge: 2023-11-28 | Disposition: A | Discharge status: Home / Self Care | Payer: PPO | Attending: Ophthalmology | Admitting: Ophthalmology

## 2023-11-28 ENCOUNTER — Ambulatory Visit: Payer: Self-pay | Admitting: Anesthesiology

## 2023-11-28 DIAGNOSIS — H2511 Age-related nuclear cataract, right eye: Secondary | ICD-10-CM | POA: Insufficient documentation

## 2023-11-28 DIAGNOSIS — Z79899 Other long term (current) drug therapy: Secondary | ICD-10-CM | POA: Diagnosis not present

## 2023-11-28 HISTORY — PX: CATARACT EXTRACTION W/PHACO: SHX586

## 2023-11-28 HISTORY — DX: Nausea with vomiting, unspecified: Z98.890

## 2023-11-28 HISTORY — DX: Other specified postprocedural states: R11.2

## 2023-11-28 SURGERY — PHACOEMULSIFICATION, CATARACT, WITH IOL INSERTION
Anesthesia: General | Site: Eye | Laterality: Right

## 2023-11-28 MED ORDER — TETRACAINE HCL 0.5 % OP SOLN
1.0000 [drp] | OPHTHALMIC | Status: DC | PRN
  Administered 2023-11-28 (×3): 1 [drp] via OPHTHALMIC

## 2023-11-28 MED ORDER — BRIMONIDINE TARTRATE-TIMOLOL 0.2-0.5 % OP SOLN
OPHTHALMIC | Status: DC | PRN
  Administered 2023-11-28: 1 [drp] via OPHTHALMIC

## 2023-11-28 MED ORDER — FENTANYL CITRATE (PF) 100 MCG/2ML IJ SOLN
INTRAMUSCULAR | Status: DC | PRN
  Administered 2023-11-28: 50 ug via INTRAVENOUS

## 2023-11-28 MED ORDER — ARMC OPHTHALMIC DILATING DROPS
1.0000 | OPHTHALMIC | Status: DC | PRN
  Administered 2023-11-28 (×3): 1 via OPHTHALMIC

## 2023-11-28 MED ORDER — SIGHTPATH DOSE#1 BSS IO SOLN
INTRAOCULAR | Status: DC | PRN
  Administered 2023-11-28: 2 mL

## 2023-11-28 MED ORDER — PHENYLEPHRINE HCL 10 % OP SOLN
1.0000 [drp] | Freq: Once | OPHTHALMIC | Status: AC
  Administered 2023-11-28: 1 [drp] via OPHTHALMIC

## 2023-11-28 MED ORDER — MIDAZOLAM HCL 2 MG/2ML IJ SOLN
INTRAMUSCULAR | Status: AC
Start: 2023-11-28 — End: ?
  Filled 2023-11-28: qty 2

## 2023-11-28 MED ORDER — PHENYLEPHRINE HCL 10 % OP SOLN
OPHTHALMIC | Status: AC
  Filled 2023-11-28: qty 5

## 2023-11-28 MED ORDER — MOXIFLOXACIN HCL 0.5 % OP SOLN
OPHTHALMIC | Status: DC | PRN
  Administered 2023-11-28: .2 mL via OPHTHALMIC

## 2023-11-28 MED ORDER — ONDANSETRON HCL 4 MG/2ML IJ SOLN
INTRAMUSCULAR | Status: AC
  Filled 2023-11-28: qty 2

## 2023-11-28 MED ORDER — SIGHTPATH DOSE#1 NA CHONDROIT SULF-NA HYALURON 40-17 MG/ML IO SOLN
INTRAOCULAR | Status: DC | PRN
Start: 2023-11-28 — End: 2023-11-28
  Administered 2023-11-28: 1 mL via INTRAOCULAR

## 2023-11-28 MED ORDER — SIGHTPATH DOSE#1 BSS IO SOLN
INTRAOCULAR | Status: DC | PRN
  Administered 2023-11-28: 15 mL via INTRAOCULAR

## 2023-11-28 MED ORDER — SIGHTPATH DOSE#1 BSS IO SOLN
INTRAOCULAR | Status: DC | PRN
  Administered 2023-11-28: 57 mL via OPHTHALMIC

## 2023-11-28 MED ORDER — ONDANSETRON HCL 4 MG/2ML IJ SOLN
4.0000 mg | Freq: Once | INTRAMUSCULAR | Status: AC
  Administered 2023-11-28: 4 mg via INTRAVENOUS

## 2023-11-28 MED ORDER — MIDAZOLAM HCL 2 MG/2ML IJ SOLN
INTRAMUSCULAR | Status: DC | PRN
  Administered 2023-11-28 (×2): 1 mg via INTRAVENOUS

## 2023-11-28 MED ORDER — FENTANYL CITRATE (PF) 100 MCG/2ML IJ SOLN
INTRAMUSCULAR | Status: AC
Start: 2023-11-28 — End: ?
  Filled 2023-11-28: qty 2

## 2023-11-28 MED ORDER — ONDANSETRON 4 MG PO TBDP
ORAL_TABLET | ORAL | Status: AC
Start: 2023-11-28 — End: ?
  Filled 2023-11-28: qty 1

## 2023-11-28 MED ORDER — ARMC OPHTHALMIC DILATING DROPS
OPHTHALMIC | Status: AC
Start: 2023-11-28 — End: ?
  Filled 2023-11-28: qty 0.5

## 2023-11-28 MED ORDER — TETRACAINE HCL 0.5 % OP SOLN
OPHTHALMIC | Status: AC
  Filled 2023-11-28: qty 4

## 2023-11-28 SURGICAL SUPPLY — 13 items
CATARACT SUITE SIGHTPATH (MISCELLANEOUS) ×1 IMPLANT
CYSTOTOME ANG REV CUT SHRT 25G (CUTTER) ×1 IMPLANT
CYSTOTOME ANGL RVRS SHRT 25G (CUTTER) ×2 IMPLANT
FEE CATARACT SUITE SIGHTPATH (MISCELLANEOUS) ×2 IMPLANT
GLOVE BIOGEL PI IND STRL 8 (GLOVE) ×2 IMPLANT
GLOVE SURG LX STRL 8.0 MICRO (GLOVE) ×2 IMPLANT
GLOVE SURG PROTEXIS BL SZ6.5 (GLOVE) ×1 IMPLANT
GLOVE SURG SYN 6.5 PF PI BL (GLOVE) ×2 IMPLANT
LENS CLAREON VIVITY 20.5 ×1 IMPLANT
LENS IOL CLRN VT YLW 20.5 IMPLANT
NDL FILTER BLUNT 18X1 1/2 (NEEDLE) ×2 IMPLANT
NEEDLE FILTER BLUNT 18X1 1/2 (NEEDLE) ×1 IMPLANT
SYR 3ML LL SCALE MARK (SYRINGE) ×2 IMPLANT

## 2023-11-28 NOTE — Op Note (Signed)
 PREOPERATIVE DIAGNOSIS:  Nuclear sclerotic cataract of the right eye.   POSTOPERATIVE DIAGNOSIS:  Cataract   OPERATIVE PROCEDURE:ORPROCALL@   SURGEON:  Galen Manila, MD.   ANESTHESIA:  Anesthesiologist: Marisue Humble, MD CRNA: Barbette Hair, CRNA  1.      Managed anesthesia care. 2.      0.11ml of Shugarcaine was instilled in the eye following the paracentesis.   COMPLICATIONS:  None.   TECHNIQUE:   Stop and chop   DESCRIPTION OF PROCEDURE:  The patient was examined and consented in the preoperative holding area where the aforementioned topical anesthesia was applied to the right eye and then brought back to the Operating Room where the right eye was prepped and draped in the usual sterile ophthalmic fashion and a lid speculum was placed. A paracentesis was created with the side port blade and the anterior chamber was filled with viscoelastic. A near clear corneal incision was performed with the steel keratome. A continuous curvilinear capsulorrhexis was performed with a cystotome followed by the capsulorrhexis forceps. Hydrodissection and hydrodelineation were carried out with BSS on a blunt cannula. The lens was removed in a stop and chop  technique and the remaining cortical material was removed with the irrigation-aspiration handpiece. The capsular bag was inflated with viscoelastic and the CNWET0  lens was placed in the capsular bag without complication. The remaining viscoelastic was removed from the eye with the irrigation-aspiration handpiece. The wounds were hydrated. The anterior chamber was flushed with BSS and the eye was inflated to physiologic pressure. 0.42ml of Vigamox was placed in the anterior chamber. The wounds were found to be water tight. The eye was dressed with Combigan. The patient was given protective glasses to wear throughout the day and a shield with which to sleep tonight. The patient was also given drops with which to begin a drop regimen today and will follow-up  with me in one day. Implant Name Type Inv. Item Serial No. Manufacturer Lot No. LRB No. Used Action  LENS CLAREON VIVITY 20.5 - [id number]  LENS CLAREON VIVITY 20.5 [id number] [employer]  Right 1 Implanted   Procedure(s): CATARACT EXTRACTION PHACO AND INTRAOCULAR LENS PLACEMENT (IOC) RIGHT  CLAREON VIVITY 8.36 00:54.2 (Right)  Electronically signed: Galen Manila 11/28/2023 8:56 AM

## 2023-11-28 NOTE — H&P (Signed)
 [facility]   Primary Care Physician:  Tower, Audrie Gallus, MD Ophthalmologist: Dr. Druscilla Brownie  Pre-Procedure History & Physical: HPI:  ALANY BORMAN is a 73 y.o. female here for cataract surgery.   Past Medical History:  Diagnosis Date   Allergy    seasonal   Breast cancer (HCC)    left    Cyst of left knee joint    Diarrhea    Heart murmur    PONV (postoperative nausea and vomiting)     Past Surgical History:  Procedure Laterality Date   ABDOMINAL HYSTERECTOMY     complete with BSO   AUGMENTATION MAMMAPLASTY Bilateral    BREAST RECONSTRUCTION WITH PLACEMENT OF TISSUE EXPANDER AND FLEX HD (ACELLULAR HYDRATED DERMIS)     COLONOSCOPY  2006   MASTECTOMY Left July 2012    mastectomy    Prior to Admission medications   Medication Sig Start Date End Date Taking? Authorizing Provider  cholecalciferol (VITAMIN D) 1000 units tablet Take 2,000 Units by mouth daily.   Yes [provider]  Estradiol 10 MCG TABS vaginal tablet Twice weekly 12/27/16  Yes [provider]  loratadine (CLARITIN) 10 MG tablet Take 10 mg by mouth as needed.    Yes [provider]  Magnesium Chloride (MAGNESIUM DR PO) Take 1 tablet by mouth daily.   Yes [provider]  Multiple Vitamins-Minerals (MULTIVITAMIN WITH MINERALS) tablet Take 1 tablet by mouth daily.     Yes [provider]  POTASSIUM CHLORIDE PO Take by mouth.   Yes [provider]  Probiotic Product (PROBIOTIC DAILY PO) Take by mouth.   Yes [provider]  Pyridoxine HCl (VITAMIN B6 PO) Take 1 capsule by mouth daily.   Yes [provider]  TURMERIC PO Take by mouth.   Yes [provider]    Allergies as of 11/03/2023 - Review Complete 08/25/2023  Allergen Reaction Noted   Ambrosia artemisiifolia (ragweed) skin test Other (See Comments) 11/09/2022   Dilaudid [hydromorphone hcl] Rash 04/26/2011   Guaifenesin er Rash 03/21/2011    Family History  Problem  Relation Age of Onset   Alzheimer's disease Mother    Kidney cancer Father    Lung cancer Father    Gout Father    Diabetes Paternal Grandmother    Breast cancer Maternal Aunt    Liver cancer Paternal Grandfather    Colon cancer Neg Hx    Colon polyps Neg Hx    Esophageal cancer Neg Hx    Rectal cancer Neg Hx    Stomach cancer Neg Hx     Social History   Socioeconomic History   Marital status: Married    Spouse name: Not on file   Number of children: 2   Years of education: Not on file   Highest education level: Not on file  Occupational History    Employer: [employer]  Tobacco Use   Smoking status: Never    Passive exposure: Never   Smokeless tobacco: Never  Vaping Use   Vaping status: Never Used  Substance and Sexual Activity   Alcohol use: Yes    Alcohol/week: 2.0 standard drinks of alcohol    Types: 1 Glasses of wine, 1 Cans of beer per week    Comment: Occasional   Drug use: No   Sexual activity: Yes    Birth control/protection: None  Other Topics Concern   Not on file  Social History Narrative   Not on file   Social Drivers  of Health   Financial Resource Strain: Low Risk  (11/22/2023)   Overall Financial Resource Strain (CARDIA)    Difficulty of Paying Living Expenses: Not hard at all  Food Insecurity: No Food Insecurity (11/22/2023)   Hunger Vital Sign    Worried About Running Out of Food in the Last Year: Never true    Ran Out of Food in the Last Year: Never true  Transportation Needs: No Transportation Needs (11/22/2023)   PRAPARE - Administrator, Civil Service (Medical): No    Lack of Transportation (Non-Medical): No  Physical Activity: Sufficiently Active (11/22/2023)   Exercise Vital Sign    Days of Exercise per Week: 4 days    Minutes of Exercise per Session: 130 min  Stress: No Stress Concern Present (11/22/2023)   [employer] of Occupational Health - Occupational Stress Questionnaire    Feeling of Stress : Not at all   Social Connections: Socially Integrated (11/22/2023)   Social Connection and Isolation Panel [NHANES]    Frequency of Communication with Friends and Family: More than three times a week    Frequency of Social Gatherings with Friends and Family: More than three times a week    Attends Religious Services: More than 4 times per year    Active Member of [employer] or Organizations: Yes    Attends Engineer, structural: More than 4 times per year    Marital Status: Married  Catering manager Violence: Not At Risk (11/22/2023)   Humiliation, Afraid, Rape, and Kick questionnaire    Fear of Current or Ex-Partner: No    Emotionally Abused: No    Physically Abused: No    Sexually Abused: No    Review of Systems: See HPI, otherwise negative ROS  Physical Exam: BP 131/78   Pulse 71   Temp (!) 97.1 F (36.2 C) (Temporal)   Resp 12   Ht 5\' 4"  (1.626 m)   Wt 55.8 kg   SpO2 100%   BMI 21.11 kg/m  General:   Alert, cooperative in NAD Head:  Normocephalic and atraumatic. Respiratory:  Normal work of breathing. Cardiovascular:  RRR  Impression/Plan: Ikran R Birchard is here for cataract surgery.  Risks, benefits, limitations, and alternatives regarding cataract surgery have been reviewed with the patient.  Questions have been answered.  All parties agreeable.   Galen Manila, MD  11/28/2023, 8:25 AM

## 2023-11-28 NOTE — Transfer of Care (Signed)
 Immediate Anesthesia Transfer of Care Note  Patient: Shari Gonzalez  Procedure(s) Performed: CATARACT EXTRACTION PHACO AND INTRAOCULAR LENS PLACEMENT (IOC) RIGHT  CLAREON VIVITY 8.36 00:54.2 (Right: Eye)  Patient Location: PACU  Anesthesia Type: General  Level of Consciousness: awake, alert  and patient cooperative  Airway and Oxygen Therapy: Patient Spontanous Breathing and Patient connected to supplemental oxygen  Post-op Assessment: Post-op Vital signs reviewed, Patient's Cardiovascular Status Stable, Respiratory Function Stable, Patent Airway and No signs of Nausea or vomiting  Post-op Vital Signs: Reviewed and stable  Complications: No notable events documented.

## 2023-11-28 NOTE — Anesthesia Postprocedure Evaluation (Signed)
 Anesthesia Post Note  Patient: Shari Gonzalez  Procedure(s) Performed: CATARACT EXTRACTION PHACO AND INTRAOCULAR LENS PLACEMENT (IOC) RIGHT  CLAREON VIVITY 8.36 00:54.2 (Right: Eye)  Patient location during evaluation: PACU Anesthesia Type: General Level of consciousness: awake and alert Pain management: pain level controlled Vital Signs Assessment: post-procedure vital signs reviewed and stable Respiratory status: spontaneous breathing, nonlabored ventilation, respiratory function stable and patient connected to nasal cannula oxygen Cardiovascular status: stable and blood pressure returned to baseline Postop Assessment: no apparent nausea or vomiting Anesthetic complications: no   No notable events documented.   Last Vitals:  Vitals:   11/28/23 0856 11/28/23 0901  BP: (!) 94/57 96/72  Pulse: 76 77  Resp: 13 13  Temp: (!) 36.3 C (!) 36.3 C  SpO2: 98% 95%    Last Pain:  Vitals:   11/28/23 0901  TempSrc:   PainSc: 0-No pain                 Carely Nappier C Othar Curto

## 2023-11-29 ENCOUNTER — Encounter: Payer: Self-pay | Admitting: Ophthalmology

## 2023-11-29 DIAGNOSIS — H2512 Age-related nuclear cataract, left eye: Secondary | ICD-10-CM | POA: Diagnosis not present

## 2023-12-01 ENCOUNTER — Telehealth: Payer: Self-pay | Admitting: Family Medicine

## 2023-12-01 NOTE — Telephone Encounter (Signed)
 Copied from CRM [id number]. Topic: General - Other >> Nov 30, 2023  4:02 PM Clayton Bibles wrote: Reason for CRM: She is returning a call from office. I did not see any notes about the call. Please call her back if needed

## 2023-12-01 NOTE — Telephone Encounter (Signed)
I didn't call pt 

## 2023-12-06 NOTE — Discharge Instructions (Signed)

## 2023-12-12 ENCOUNTER — Encounter
Admission: RE | Disposition: A | Discharge status: Home / Self Care | Payer: Self-pay | Source: Home / Self Care | Attending: Ophthalmology

## 2023-12-12 ENCOUNTER — Ambulatory Visit: Payer: Self-pay | Admitting: Anesthesiology

## 2023-12-12 ENCOUNTER — Ambulatory Visit
Admission: RE | Admit: 2023-12-12 | Discharge: 2023-12-12 | Disposition: A | Discharge status: Home / Self Care | Payer: PPO | Attending: Ophthalmology | Admitting: Ophthalmology

## 2023-12-12 ENCOUNTER — Encounter: Payer: Self-pay | Admitting: Ophthalmology

## 2023-12-12 ENCOUNTER — Other Ambulatory Visit: Payer: Self-pay

## 2023-12-12 DIAGNOSIS — H269 Unspecified cataract: Secondary | ICD-10-CM | POA: Diagnosis not present

## 2023-12-12 DIAGNOSIS — F1090 Alcohol use, unspecified, uncomplicated: Secondary | ICD-10-CM | POA: Diagnosis not present

## 2023-12-12 DIAGNOSIS — H2512 Age-related nuclear cataract, left eye: Secondary | ICD-10-CM | POA: Insufficient documentation

## 2023-12-12 HISTORY — PX: CATARACT EXTRACTION W/PHACO: SHX586

## 2023-12-12 SURGERY — PHACOEMULSIFICATION, CATARACT, WITH IOL INSERTION
Anesthesia: Monitor Anesthesia Care | Laterality: Left

## 2023-12-12 MED ORDER — SIGHTPATH DOSE#1 BSS IO SOLN
INTRAOCULAR | Status: DC | PRN
Start: 2023-12-12 — End: 2023-12-12
  Administered 2023-12-12: 15 mL via INTRAOCULAR

## 2023-12-12 MED ORDER — MIDAZOLAM HCL 2 MG/2ML IJ SOLN
INTRAMUSCULAR | Status: AC
  Filled 2023-12-12: qty 2

## 2023-12-12 MED ORDER — TETRACAINE HCL 0.5 % OP SOLN
OPHTHALMIC | Status: AC
  Filled 2023-12-12: qty 4

## 2023-12-12 MED ORDER — SIGHTPATH DOSE#1 NA CHONDROIT SULF-NA HYALURON 40-17 MG/ML IO SOLN
INTRAOCULAR | Status: DC | PRN
  Administered 2023-12-12: 1 mL via INTRAOCULAR

## 2023-12-12 MED ORDER — BRIMONIDINE TARTRATE-TIMOLOL 0.2-0.5 % OP SOLN
OPHTHALMIC | Status: DC | PRN
  Administered 2023-12-12: 1 [drp] via OPHTHALMIC

## 2023-12-12 MED ORDER — ONDANSETRON HCL 4 MG/2ML IJ SOLN
INTRAMUSCULAR | Status: AC
Start: 2023-12-12 — End: ?
  Filled 2023-12-12: qty 2

## 2023-12-12 MED ORDER — MIDAZOLAM HCL 2 MG/2ML IJ SOLN
INTRAMUSCULAR | Status: DC | PRN
  Administered 2023-12-12 (×2): 1 mg via INTRAVENOUS

## 2023-12-12 MED ORDER — SIGHTPATH DOSE#1 BSS IO SOLN
INTRAOCULAR | Status: DC | PRN
  Administered 2023-12-12: 63 mL via OPHTHALMIC

## 2023-12-12 MED ORDER — MOXIFLOXACIN HCL 0.5 % OP SOLN
OPHTHALMIC | Status: DC | PRN
  Administered 2023-12-12: .2 mL via OPHTHALMIC

## 2023-12-12 MED ORDER — FENTANYL CITRATE (PF) 100 MCG/2ML IJ SOLN
INTRAMUSCULAR | Status: AC
  Filled 2023-12-12: qty 2

## 2023-12-12 MED ORDER — LIDOCAINE HCL (PF) 2 % IJ SOLN
INTRAOCULAR | Status: DC | PRN
  Administered 2023-12-12: 1 mL

## 2023-12-12 MED ORDER — ARMC OPHTHALMIC DILATING DROPS
1.0000 | OPHTHALMIC | Status: DC | PRN
  Administered 2023-12-12 (×3): 1 via OPHTHALMIC

## 2023-12-12 MED ORDER — PHENYLEPHRINE HCL 10 % OP SOLN
OPHTHALMIC | Status: AC
  Filled 2023-12-12: qty 5

## 2023-12-12 MED ORDER — PHENYLEPHRINE HCL 10 % OP SOLN
1.0000 [drp] | Freq: Once | OPHTHALMIC | Status: AC
  Administered 2023-12-12: 1 [drp] via OPHTHALMIC

## 2023-12-12 MED ORDER — TETRACAINE HCL 0.5 % OP SOLN
1.0000 [drp] | OPHTHALMIC | Status: DC | PRN
  Administered 2023-12-12 (×3): 1 [drp] via OPHTHALMIC

## 2023-12-12 MED ORDER — ARMC OPHTHALMIC DILATING DROPS
OPHTHALMIC | Status: AC
  Filled 2023-12-12: qty 0.5

## 2023-12-12 MED ORDER — FENTANYL CITRATE (PF) 100 MCG/2ML IJ SOLN
INTRAMUSCULAR | Status: DC | PRN
  Administered 2023-12-12: 50 ug via INTRAVENOUS

## 2023-12-12 MED ORDER — ONDANSETRON HCL 4 MG/2ML IJ SOLN
4.0000 mg | Freq: Once | INTRAMUSCULAR | Status: AC
  Administered 2023-12-12: 4 mg via INTRAVENOUS

## 2023-12-12 SURGICAL SUPPLY — 13 items
CATARACT SUITE SIGHTPATH (MISCELLANEOUS) ×1 IMPLANT
CYSTOTOME ANG REV CUT SHRT 25G (CUTTER) ×1 IMPLANT
CYSTOTOME ANGL RVRS SHRT 25G (CUTTER) ×2 IMPLANT
FEE CATARACT SUITE SIGHTPATH (MISCELLANEOUS) ×2 IMPLANT
GLOVE BIOGEL PI IND STRL 8 (GLOVE) ×2 IMPLANT
GLOVE SURG LX STRL 8.0 MICRO (GLOVE) ×2 IMPLANT
GLOVE SURG PROTEXIS BL SZ6.5 (GLOVE) ×1 IMPLANT
GLOVE SURG SYN 6.5 PF PI BL (GLOVE) ×2 IMPLANT
LENS CLAREON VIVITY 20.5 ×1 IMPLANT
LENS IOL CLRN VT YLW 20.5 IMPLANT
NDL FILTER BLUNT 18X1 1/2 (NEEDLE) ×2 IMPLANT
NEEDLE FILTER BLUNT 18X1 1/2 (NEEDLE) ×1 IMPLANT
SYR 3ML LL SCALE MARK (SYRINGE) ×2 IMPLANT

## 2023-12-12 NOTE — Anesthesia Postprocedure Evaluation (Signed)
 Anesthesia Post Note  Patient: Shari Gonzalez  Procedure(s) Performed: CATARACT EXTRACTION PHACO AND INTRAOC ULAR LENS PLACEMENT (IOC) LEFT CLAREON VIVITY 6.21, 00:53.2 (Left)  Patient location during evaluation: PACU Anesthesia Type: MAC Level of consciousness: awake and alert Pain management: pain level controlled Vital Signs Assessment: post-procedure vital signs reviewed and stable Respiratory status: spontaneous breathing, nonlabored ventilation, respiratory function stable and patient connected to nasal cannula oxygen Cardiovascular status: stable and blood pressure returned to baseline Postop Assessment: no apparent nausea or vomiting Anesthetic complications: no   No notable events documented.   Last Vitals:  Vitals:   12/12/23 0921 12/12/23 0924  BP: 109/70   Pulse: 69 65  Resp: 14   Temp:    SpO2: 98% 98%    Last Pain:  Vitals:   12/12/23 0924  TempSrc:   PainSc: 0-No pain                 Marisue Humble

## 2023-12-12 NOTE — Op Note (Signed)
 PREOPERATIVE DIAGNOSIS:  Nuclear sclerotic cataract of the left eye.   POSTOPERATIVE DIAGNOSIS:  Nuclear sclerotic cataract of the left eye.   OPERATIVE PROCEDURE:ORPROCALL@   SURGEON:  Galen Manila, MD.   ANESTHESIA:  Anesthesiologist: Marisue Humble, MD CRNA: Barbette Hair, CRNA  1.      Managed anesthesia care. 2.     0.44ml of Shugarcaine was instilled following the paracentesis   COMPLICATIONS:  None.   TECHNIQUE:   Stop and chop   DESCRIPTION OF PROCEDURE:  The patient was examined and consented in the preoperative holding area where the aforementioned topical anesthesia was applied to the left eye and then brought back to the Operating Room where the left eye was prepped and draped in the usual sterile ophthalmic fashion and a lid speculum was placed. A paracentesis was created with the side port blade and the anterior chamber was filled with viscoelastic. A near clear corneal incision was performed with the steel keratome. A continuous curvilinear capsulorrhexis was performed with a cystotome followed by the capsulorrhexis forceps. Hydrodissection and hydrodelineation were carried out with BSS on a blunt cannula. The lens was removed in a stop and chop  technique and the remaining cortical material was removed with the irrigation-aspiration handpiece. The capsular bag was inflated with viscoelastic and the CNWET0lens was placed in the capsular bag without complication. The remaining viscoelastic was removed from the eye with the irrigation-aspiration handpiece. The wounds were hydrated. The anterior chamber was flushed with BSS and the eye was inflated to physiologic pressure. 0.24ml Vigamox was placed in the anterior chamber. The wounds were found to be water tight. The eye was dressed with Combigan. The patient was given protective glasses to wear throughout the day and a shield with which to sleep tonight. The patient was also given drops with which to begin a drop regimen today and  will follow-up with me in one day. Implant Name Type Inv. Item Serial No. Manufacturer Lot No. LRB No. Used Action  LENS CLAREON VIVITY 20.5 - [id number]  LENS CLAREON VIVITY 20.5 [id number] [employer]  Left 1 Implanted    Procedure(s): CATARACT EXTRACTION PHACO AND INTRAOC ULAR LENS PLACEMENT (IOC) LEFT CLAREON VIVITY 6.21, 00:53.2 (Left)  Electronically signed: Galen Manila 12/12/2023 9:16 AM

## 2023-12-12 NOTE — H&P (Signed)
 [facility]   Primary Care Physician:  Tower, Audrie Gallus, MD Ophthalmologist: Dr. Druscilla Brownie  Pre-Procedure History & Physical: HPI:  Shari Gonzalez is a 73 y.o. female here for cataract surgery.   Past Medical History:  Diagnosis Date   Allergy    seasonal   Breast cancer (HCC)    left    Cyst of left knee joint    Diarrhea    Heart murmur    PONV (postoperative nausea and vomiting)     Past Surgical History:  Procedure Laterality Date   ABDOMINAL HYSTERECTOMY     complete with BSO   AUGMENTATION MAMMAPLASTY Bilateral    BREAST RECONSTRUCTION WITH PLACEMENT OF TISSUE EXPANDER AND FLEX HD (ACELLULAR HYDRATED DERMIS)     CATARACT EXTRACTION W/PHACO Right 11/28/2023   Procedure: CATARACT EXTRACTION PHACO AND INTRAOCULAR LENS PLACEMENT (IOC) RIGHT  CLAREON VIVITY 8.36 00:54.2;  Surgeon: Galen Manila, MD;  Location: [facility];  Service: Ophthalmology;  Laterality: Right;   COLONOSCOPY  2006   MASTECTOMY Left July 2012    mastectomy    Prior to Admission medications   Medication Sig Start Date End Date Taking? Authorizing Provider  cholecalciferol (VITAMIN D) 1000 units tablet Take 2,000 Units by mouth daily.   Yes [provider]  Estradiol 10 MCG TABS vaginal tablet Twice weekly 12/27/16  Yes [provider]  loratadine (CLARITIN) 10 MG tablet Take 10 mg by mouth as needed.    Yes [provider]  Magnesium Chloride (MAGNESIUM DR PO) Take 1 tablet by mouth daily.   Yes [provider]  Multiple Vitamins-Minerals (MULTIVITAMIN WITH MINERALS) tablet Take 1 tablet by mouth daily.     Yes [provider]  POTASSIUM CHLORIDE PO Take by mouth.   Yes [provider]  Probiotic Product (PROBIOTIC DAILY PO) Take by mouth.   Yes [provider]  Pyridoxine HCl (VITAMIN B6 PO) Take 1 capsule by mouth daily.   Yes [provider]  TURMERIC PO Take by mouth.   Yes [provider]     Allergies as of 11/03/2023 - Review Complete 08/25/2023  Allergen Reaction Noted   Ambrosia artemisiifolia (ragweed) skin test Other (See Comments) 11/09/2022   Dilaudid [hydromorphone hcl] Rash 04/26/2011   Guaifenesin er Rash 03/21/2011    Family History  Problem Relation Age of Onset   Alzheimer's disease Mother    Kidney cancer Father    Lung cancer Father    Gout Father    Diabetes Paternal Grandmother    Breast cancer Maternal Aunt    Liver cancer Paternal Grandfather    Colon cancer Neg Hx    Colon polyps Neg Hx    Esophageal cancer Neg Hx    Rectal cancer Neg Hx    Stomach cancer Neg Hx     Social History   Socioeconomic History   Marital status: Married    Spouse name: Not on file   Number of children: 2   Years of education: Not on file   Highest education level: Not on file  Occupational History    Employer: [employer]  Tobacco Use   Smoking status: Never    Passive exposure: Never   Smokeless tobacco: Never  Vaping Use   Vaping status: Never Used  Substance and Sexual Activity   Alcohol use: Yes    Alcohol/week: 2.0 standard drinks of alcohol    Types: 1 Glasses of wine, 1 Cans of beer per week  Comment: Occasional   Drug use: No   Sexual activity: Yes    Birth control/protection: None  Other Topics Concern   Not on file  Social History Narrative   Not on file   Social Drivers of Health   Financial Resource Strain: Low Risk  (11/22/2023)   Overall Financial Resource Strain (CARDIA)    Difficulty of Paying Living Expenses: Not hard at all  Food Insecurity: No Food Insecurity (11/22/2023)   Hunger Vital Sign    Worried About Running Out of Food in the Last Year: Never true    Ran Out of Food in the Last Year: Never true  Transportation Needs: No Transportation Needs (11/22/2023)   PRAPARE - Administrator, Civil Service (Medical): No    Lack of Transportation (Non-Medical): No  Physical Activity: Sufficiently Active  (11/22/2023)   Exercise Vital Sign    Days of Exercise per Week: 4 days    Minutes of Exercise per Session: 130 min  Stress: No Stress Concern Present (11/22/2023)   [employer] of Occupational Health - Occupational Stress Questionnaire    Feeling of Stress : Not at all  Social Connections: Socially Integrated (11/22/2023)   Social Connection and Isolation Panel [NHANES]    Frequency of Communication with Friends and Family: More than three times a week    Frequency of Social Gatherings with Friends and Family: More than three times a week    Attends Religious Services: More than 4 times per year    Active Member of [employer] or Organizations: Yes    Attends Engineer, structural: More than 4 times per year    Marital Status: Married  Catering manager Violence: Not At Risk (11/22/2023)   Humiliation, Afraid, Rape, and Kick questionnaire    Fear of Current or Ex-Partner: No    Emotionally Abused: No    Physically Abused: No    Sexually Abused: No    Review of Systems: See HPI, otherwise negative ROS  Physical Exam: BP 122/76   Pulse 64   Temp (!) 97.5 F (36.4 C) (Temporal)   Resp 10   Ht 5\' 4"  (1.626 m)   Wt 56.2 kg   SpO2 100%   BMI 21.28 kg/m  General:   Alert, cooperative in NAD Head:  Normocephalic and atraumatic. Respiratory:  Normal work of breathing. Cardiovascular:  RRR  Impression/Plan: Shari Gonzalez is here for cataract surgery.  Risks, benefits, limitations, and alternatives regarding cataract surgery have been reviewed with the patient.  Questions have been answered.  All parties agreeable.   Galen Manila, MD  12/12/2023, 8:36 AM

## 2023-12-12 NOTE — Transfer of Care (Signed)
 Immediate Anesthesia Transfer of Care Note  Patient: Shari Gonzalez  Procedure(s) Performed: CATARACT EXTRACTION PHACO AND INTRAOC ULAR LENS PLACEMENT (IOC) LEFT CLAREON VIVITY 6.21, 00:53.2 (Left)  Patient Location: PACU  Anesthesia Type: MAC  Level of Consciousness: awake, alert  and patient cooperative  Airway and Oxygen Therapy: Patient Spontanous Breathing and Patient connected to supplemental oxygen  Post-op Assessment: Post-op Vital signs reviewed, Patient's Cardiovascular Status Stable, Respiratory Function Stable, Patent Airway and No signs of Nausea or vomiting  Post-op Vital Signs: Reviewed and stable  Complications: No notable events documented.

## 2023-12-12 NOTE — Anesthesia Preprocedure Evaluation (Signed)
 Anesthesia Evaluation  Patient identified by MRN, date of birth, ID band Patient awake    Reviewed: Allergy & Precautions, H&P , NPO status , Patient's Chart, lab work & pertinent test results  History of Anesthesia Complications (+) PONV and history of anesthetic complications  Airway Mallampati: III  TM Distance: >3 FB Neck ROM: Full    Dental no notable dental hx.    Pulmonary neg pulmonary ROS   Pulmonary exam normal breath sounds clear to auscultation       Cardiovascular negative cardio ROS Normal cardiovascular exam+ Valvular Problems/Murmurs  Rhythm:Regular Rate:Normal     Neuro/Psych negative neurological ROS  negative psych ROS   GI/Hepatic negative GI ROS, Neg liver ROS,,,  Endo/Other  negative endocrine ROS    Renal/GU negative Renal ROS  negative genitourinary   Musculoskeletal negative musculoskeletal ROS (+)    Abdominal   Peds negative pediatric ROS (+)  Hematology negative hematology ROS (+)   Anesthesia Other Findings Allergy  Breast cancer  Heart murmur  Diarrhea Cyst of left knee joint  PONV (postoperative nausea and vomiting) Previous cataract surgery 11-28-23    Reproductive/Obstetrics negative OB ROS                              Anesthesia Physical Anesthesia Plan  ASA: 2  Anesthesia Plan: MAC   Post-op Pain Management:    Induction: Intravenous  PONV Risk Score and Plan:   Airway Management Planned: Natural Airway and Nasal Cannula  Additional Equipment:   Intra-op Plan:   Post-operative Plan:   Informed Consent: I have reviewed the patients History and Physical, chart, labs and discussed the procedure including the risks, benefits and alternatives for the proposed anesthesia with the patient or authorized representative who has indicated his/her understanding and acceptance.     Dental Advisory Given  Plan Discussed with:  Anesthesiologist, CRNA and Surgeon  Anesthesia Plan Comments: (Patient consented for risks of anesthesia including but not limited to:  - adverse reactions to medications - damage to eyes, teeth, lips or other oral mucosa - nerve damage due to positioning  - sore throat or hoarseness - Damage to heart, brain, nerves, lungs, other parts of body or loss of life  Patient voiced understanding and assent.)         Anesthesia Quick Evaluation

## 2023-12-14 ENCOUNTER — Encounter: Payer: Self-pay | Admitting: Ophthalmology

## 2024-03-06 DIAGNOSIS — L814 Other melanin hyperpigmentation: Secondary | ICD-10-CM | POA: Diagnosis not present

## 2024-03-06 DIAGNOSIS — X32XXXA Exposure to sunlight, initial encounter: Secondary | ICD-10-CM | POA: Diagnosis not present

## 2024-03-06 DIAGNOSIS — L821 Other seborrheic keratosis: Secondary | ICD-10-CM | POA: Diagnosis not present

## 2024-03-06 DIAGNOSIS — D225 Melanocytic nevi of trunk: Secondary | ICD-10-CM | POA: Diagnosis not present

## 2024-03-06 DIAGNOSIS — L570 Actinic keratosis: Secondary | ICD-10-CM | POA: Diagnosis not present

## 2024-06-14 DIAGNOSIS — H35371 Puckering of macula, right eye: Secondary | ICD-10-CM | POA: Diagnosis not present

## 2024-06-14 DIAGNOSIS — Z961 Presence of intraocular lens: Secondary | ICD-10-CM | POA: Diagnosis not present

## 2024-06-14 DIAGNOSIS — H26491 Other secondary cataract, right eye: Secondary | ICD-10-CM | POA: Diagnosis not present

## 2024-10-04 ENCOUNTER — Telehealth: Payer: Self-pay | Admitting: *Deleted

## 2024-10-04 MED ORDER — SCOPOLAMINE 1 MG/3DAYS TD PT72: 1.0000 | MEDICATED_PATCH | TRANSDERMAL | 0 refills | Status: AC

## 2024-10-04 NOTE — Telephone Encounter (Signed)
 Pt notified Rx sent to the pharmacy and sedation caution given

## 2024-10-04 NOTE — Telephone Encounter (Signed)
 Copied from CRM #[id number]. Topic: Clinical - Medication Question >> Oct 04, 2024  2:15 PM Sasha M wrote: Reason for CRM: Pt called in to ask if she can get a script for motion sickness patches for her upcoming cruise on 01/25. Please call pt to advise

## 2024-10-04 NOTE — Telephone Encounter (Signed)
 Caution of sedation  Sent to pharmacy  [facility] it helps

## 2024-11-26 ENCOUNTER — Ambulatory Visit: Payer: PPO

## 2024-11-27 ENCOUNTER — Encounter: Payer: PPO | Admitting: Family Medicine

## 2024-12-20 ENCOUNTER — Ambulatory Visit: Payer: PPO
